# Patient Record
Sex: Female | Born: 1950 | Race: White | Hispanic: No | Marital: Single | Smoking: Former smoker
Health system: Southern US, Community
[De-identification: ages and names within clinical notes are randomized; demographics above are authoritative.]

## PROBLEM LIST (undated history)

## (undated) DIAGNOSIS — F419 Anxiety disorder, unspecified: Secondary | ICD-10-CM

## (undated) DIAGNOSIS — F32A Depression, unspecified: Secondary | ICD-10-CM

## (undated) DIAGNOSIS — F329 Major depressive disorder, single episode, unspecified: Secondary | ICD-10-CM

## (undated) DIAGNOSIS — T7840XA Allergy, unspecified, initial encounter: Secondary | ICD-10-CM

## (undated) DIAGNOSIS — E039 Hypothyroidism, unspecified: Secondary | ICD-10-CM

## (undated) DIAGNOSIS — C801 Malignant (primary) neoplasm, unspecified: Secondary | ICD-10-CM

## (undated) DIAGNOSIS — E079 Disorder of thyroid, unspecified: Secondary | ICD-10-CM

## (undated) HISTORY — DX: Disorder of thyroid, unspecified: E07.9

## (undated) HISTORY — DX: Malignant (primary) neoplasm, unspecified: C80.1

## (undated) HISTORY — PX: HERNIA REPAIR: SHX51

## (undated) HISTORY — DX: Allergy, unspecified, initial encounter: T78.40XA

## (undated) HISTORY — DX: Anxiety disorder, unspecified: F41.9

## (undated) HISTORY — PX: COLON SURGERY: SHX602

## (undated) HISTORY — DX: Depression, unspecified: F32.A

## (undated) HISTORY — DX: Major depressive disorder, single episode, unspecified: F32.9

---

## 2001-03-13 ENCOUNTER — Encounter: Admission: RE | Admit: 2001-03-13 | Discharge: 2001-03-13 | Payer: Self-pay | Admitting: Family Medicine

## 2001-03-13 ENCOUNTER — Encounter: Payer: Self-pay | Admitting: Family Medicine

## 2001-04-03 ENCOUNTER — Other Ambulatory Visit: Admission: RE | Admit: 2001-04-03 | Discharge: 2001-04-03 | Payer: Self-pay | Admitting: Family Medicine

## 2001-05-20 ENCOUNTER — Ambulatory Visit (HOSPITAL_COMMUNITY): Admission: RE | Admit: 2001-05-20 | Discharge: 2001-05-20 | Payer: Self-pay | Admitting: Gastroenterology

## 2002-06-30 ENCOUNTER — Other Ambulatory Visit: Admission: RE | Admit: 2002-06-30 | Discharge: 2002-06-30 | Payer: Self-pay | Admitting: Family Medicine

## 2002-07-23 ENCOUNTER — Encounter: Payer: Self-pay | Admitting: Family Medicine

## 2002-07-23 ENCOUNTER — Encounter: Admission: RE | Admit: 2002-07-23 | Discharge: 2002-07-23 | Payer: Self-pay | Admitting: Family Medicine

## 2003-10-21 ENCOUNTER — Encounter: Admission: RE | Admit: 2003-10-21 | Discharge: 2003-10-21 | Payer: Self-pay | Admitting: Family Medicine

## 2003-10-28 ENCOUNTER — Other Ambulatory Visit: Admission: RE | Admit: 2003-10-28 | Discharge: 2003-10-28 | Payer: Self-pay | Admitting: Family Medicine

## 2004-04-14 ENCOUNTER — Encounter: Admission: RE | Admit: 2004-04-14 | Discharge: 2004-04-14 | Payer: Self-pay | Admitting: Family Medicine

## 2004-05-08 ENCOUNTER — Inpatient Hospital Stay (HOSPITAL_COMMUNITY): Admission: RE | Admit: 2004-05-08 | Discharge: 2004-05-14 | Payer: Self-pay | Admitting: General Surgery

## 2004-05-08 ENCOUNTER — Encounter (INDEPENDENT_AMBULATORY_CARE_PROVIDER_SITE_OTHER): Payer: Self-pay | Admitting: *Deleted

## 2004-11-02 ENCOUNTER — Ambulatory Visit: Admission: RE | Admit: 2004-11-02 | Discharge: 2004-11-02 | Payer: Self-pay | Admitting: Family Medicine

## 2005-07-10 ENCOUNTER — Ambulatory Visit (HOSPITAL_COMMUNITY): Admission: RE | Admit: 2005-07-10 | Discharge: 2005-07-11 | Payer: Self-pay | Admitting: Surgery

## 2006-11-19 ENCOUNTER — Encounter: Admission: RE | Admit: 2006-11-19 | Discharge: 2006-11-19 | Payer: Self-pay | Admitting: Family Medicine

## 2007-06-25 ENCOUNTER — Encounter: Admission: RE | Admit: 2007-06-25 | Discharge: 2007-06-25 | Payer: Self-pay | Admitting: Family Medicine

## 2010-03-10 ENCOUNTER — Encounter
Admission: RE | Admit: 2010-03-10 | Discharge: 2010-03-10 | Payer: Self-pay | Source: Home / Self Care | Attending: Family Medicine | Admitting: Family Medicine

## 2010-07-21 NOTE — Op Note (Signed)
[place]  Patient:    Brandy Brooks, Brandy Brooks Visit Number: [id number] MRN: [id number]          Service Type: END Location: ENDO Attending Physician:  Charna Lavera Dictated by:   Anselmo Rod, M.D. Proc. Date: 05/20/01 Admit Date:  05/20/2001   CC:         Talmadge Coventry, M.D.   Operative Report  DATE OF BIRTH:  May 20, 1950  REFERRING PHYSICIAN:  Talmadge Coventry, M.D.  PROCEDURE PERFORMED:  Colonoscopy.  ENDOSCOPIST:  Anselmo Rod, M.D.  INSTRUMENT USED:  Olympus video colonoscope.  INDICATIONS FOR PROCEDURE:  The patient is a 60 year old white female with a history of guaiac positive stools and family history of ulcerative colitis and colonic polyps (in her mother).  Rule out colonic polyps, masses, hemorrhoids, etc.  PREPROCEDURE PREPARATION:  Informed consent was procured from the patient. The patient was fasted for eight hours prior to the procedure and prepped with a bottle of magnesium citrate and a gallon of NuLytely the night prior to the procedure.  PREPROCEDURE PHYSICAL:  The patient had stable vital signs.  Neck supple. Chest clear to auscultation.  S1, S2 regular.  Abdomen soft with normal bowel sounds.  DESCRIPTION OF PROCEDURE:  The patient was placed in the left lateral decubitus position and sedated with 60 mg of Demerol and 6 mg of Versed intravenously.  Once the patient was adequately sedated and maintained on low-flow oxygen and continuous cardiac monitoring, the Olympus video colonoscope was advanced from the rectum to the cecum without difficulty.  The terminal ileum was also visualized and appeared healthy and without lesions. the appendicular orifice and ileocecal valve were photographed.  The entire colonic mucosa appeared healthy with no evidence of masses, polyps, erosions, ulcers, ulcers or diverticular disease.  There was no evidence of hemorrhoids.  IMPRESSION:  Normal colonoscopy up to  the terminal ileum.  RECOMMENDATIONS: 1. Repeat guaiac testing will be done on an outpatient basis and further    recommendations made as needed. 2. Repeat colorectal cancer screening is recommended in the next five to    10 years unless the patient were to develop any abnormal symptoms in the    interim. Dictated by:   Anselmo Rod, M.D. Attending Physician:  Charna Myliah DD:  05/20/01 TD:  05/20/01 Job: [id number]

## 2010-07-21 NOTE — Op Note (Signed)
[name], Brandy Brooks NO.:  [id number]   MEDICAL RECORD NO.:  [id number]          PATIENT TYPE:  INP   LOCATION:  2550                         FACILITY:  [facility]   PHYSICIAN:  Gita Kudo, M.D. DATE OF BIRTH:  01-04-1951   DATE OF PROCEDURE:  05/08/2004  DATE OF DISCHARGE:                                 OPERATIVE REPORT   OPERATIVE PROCEDURE:  Exploratory laparotomy, resection of left colon,  splenic flexure with mass.   SURGEON:  Gita Kudo, M.D.   ASSISTANT:  Leonie Man, M.D.   ANESTHESIA:  General endotracheal.   PREOPERATIVE DIAGNOSIS:  Cystic mass near left colon.   POSTOPERATIVE DIAGNOSIS:  Cystic mass near left colon, frozen section  probably benign.   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 Colonoscopy a year ago was nonrevealing. CAT scan of the abdomen  and pelvis showed a cystic mass abutting against the left colon but not  intimately attached to it or connected to with contrast studies. Other  structures were normal.   OPERATIVE FINDINGS:  A full laparotomy was performed. There was some free  fluid in the pelvis and the patient had a multiply lobulated cystic mass one  of which was flat and had obviously ruptured recently as correlated by the  fluid in the abdomen. The mass was on the mesenteric side of the very  proximal descending colon. Manual and visual examination revealed a normal  pancreas, stomach, liver, gallbladder. The entire small bowel was run and  there was no abnormality. The appendix looked normal. The right and  transverse colons were normal. The sigmoid colon was also normal down to the  rectum. There were several fibroids on the uterus. There was a small benign  looking nodule on the right fallopian tube that I excised. Both ovaries  looked and felt normal. The mass was close to the bowel and I felt best to  excise the bowel with it and we did so.   OPERATIVE  PROCEDURE:  Under satisfactory general endotracheal anesthesia,  with nasogastric, Foley catheters placed and given IV antibiotics after a  bowel prep, the patient was positioned, prepped and draped in standard  fashion. A generous midline incision was made which was extended as the  procedure enfolded. Self-retaining retractors were placed after laparotomy  was performed. It was felt best to mobilize the entire colon and take down  the splenic flexure. The colon was taken down using cautery. At the splenic  flexure both of blunt sharp dissection was used with clamp, cutting and  tying of 2-0 silk to finally free it down. This then was extended freeing  the colon over to the distal transverse colon. The mass was intimately  adherent to the mesenteric surface of the bowel and I felt best to remove  the bowel with it. Accordingly the bowel was mobilized medially and then  transected proximally and distally with a GIA stapler. The mesentery was  then taken between clamps and ties of 2-0 silk. The specimen was marked with  suture and sent to pathology.  While waiting, the anastomosis was performed  with the GIA stapler. The stab wounds were then oversewn interrupted Gambee  silk sutures. Another silk suture was placed at the crotch of the staple  line. The staple line was inspected before the anastomosis completed and it  had been reinforced for some oozing with interrupted 3-0 silk. The  mesenteric defect was then closed with interrupted 2-0 silk figure-of-  eights. At this point, gloves were changed and the abdomen checked for  hemostasis. There was some bleeding from the very inferior portion of the  spleen where traction had caused a short vessels of the spleen to hemorrhage  and this was controlled with multiple clips as well as a 2-0 silk suture  ligature. It had a tiny ooze on the capsule and a piece of Surgicel was  inserted  Then the abdomen was copiously lavaged with saline. The gloves  had  been changed. The anastomosis was checked and was patent, hemostatic, had  good blood supply and lay without tension. All packings were removed and the  abdomen checked for hemostasis, lavaged with saline again and suctioned dry.  Closure was accomplished with a running double stranded #1 PDS suture for  the midline. Marcaine 30 mL was infiltrated for postop analgesia. Skin edges  approximated with staples and sterile absorbent dressing applied. No  complications and the sponge and needle counts correct.      MRL/MEDQ  D:  05/08/2004  T:  05/08/2004  Job:  [id number]   cc:   Talmadge Coventry, M.D.  [address]  [place]  [place] [address]  Fax: [phone]

## 2010-07-21 NOTE — Discharge Summary (Signed)
Brandy Brooks, MONTUORI NO.:  [id number]   MEDICAL RECORD NO.:  [id number]          PATIENT TYPE:  INP   LOCATION:  5705                         FACILITY:  [facility]   PHYSICIAN:  Gita Kudo, M.D. DATE OF BIRTH:  [birth year]   DATE OF ADMISSION:  05/08/2004  DATE OF DISCHARGE:                                 DISCHARGE SUMMARY   CHIEF COMPLAINT:  Abdominal mass.   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 Workup revealed a cystic mass,  possible GI duplication cyst near her left colon.  Her remaining history and  physical is negative.   LABORATORY STUDIES:  Pathology:  Segmental resection of the splenic flexure  and proximal descending colon with a benign cystic mass.  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  Right fallopian tube nodule  consistent with a benign cyst.  Chest x-ray showed some atelectasis - this  was postop.  A preop chest film was negative.  The patient was O negative,  did not receive any transfusions.  Urinalysis was negative.  CMET normal.  Initial hemoglobin 14, hematocrit 40, white count 4,100.  Postop hemoglobin  11, hematocrit 31, white count 8,900.   HOSPITAL COURSE:  On the morning of admission, the patient went to the  operating room and had a resection of her colon with the adjacent mass.  A  primary anastomosis was performed.  Postoperatively, she did well.  She had  a little difficulty maintaining O2 saturations and was on oxygen and  respiratory therapy.  This improved nicely with aggressive pulmonary care.  She regained bowel function, was voiding.  Her catheters were removed on  schedule, and she was tolerating a diet and passing gas when discharged from  the hospital on May 14, 2004, her sixth postop day.  She was allowed home  on a regular diet, Vicodin for pain, continue her previous medications.  Return to the office for followup in  approximately three weeks.   DISCHARGE DIAGNOSIS:  Mesothelioma.   OPERATION:  Resection of splenic flexure and descending colon with adjacent  mesothelioma.   COMPLICATIONS, INFECTIONS, CONSULTATIONS:  None.   CONDITION AT DISCHARGE:  Good.      MRL/MEDQ  D:  05/14/2004  T:  05/15/2004  Job:  [id number]   cc:   Talmadge Coventry, M.D.  [address]  [place]  [place] [address]  Fax: [phone]

## 2010-07-21 NOTE — Op Note (Signed)
NAMEOLIVIA, Brandy Brooks          ACCOUNT NO.:  [id number]   MEDICAL RECORD NO.:  [id number]          PATIENT TYPE:  AMB   LOCATION:  DAY                          FACILITY:  [facility]   PHYSICIAN:  Thornton Park. Daphine Deutscher, MD  DATE OF BIRTH:  Mar 24, 1950   DATE OF PROCEDURE:  DATE OF DISCHARGE:                                 OPERATIVE REPORT   PREOPERATIVE DIAGNOSIS:  Ventral incisional hernia.   POSTOPERATIVE DIAGNOSIS:  Ventral incisional hernia status post repair with  12 cm round __________ polyester coated mesh.   SURGEON:  Thornton Park. Daphine Deutscher, MD.   ANESTHESIA:  General.   ASSISTANT:  None.   DESCRIPTION OF PROCEDURE:  Ms. Hafsah Hendler was taken to room 1 on Jul 10, 2005 and given general anesthesia.  The abdomen was prepped with  chlorhexidine and draped sterilely.  I entered the abdomen using an OptiView  technique to the right upper quadrant without difficulty and insufflated the  abdomen through this 5 mm trocar.  A second 5 mm was placed on the right  side inferiorly and I then used a harmonic scalpel to take down numerous  vascular adhesions that were stuck up in this ventral hernia.  The hernia  was actually where the fascial dehiscence at side of the umbilicus and all  adhesions were taken down to delineate the size of this.  It was about two  fingerbreadth's in diameter at least grossly when I saw her preoperatively.  I went ahead and used needles to mark the parameter to give a good margin of  normal tissue lateral to the hernia defect.  I then used a 12 cm auto suture  polyester mesh with a coated side protecting the bowel.  This was oriented.  Four sutures of Novofil were placed on the uncoated side to be pulled up to  the anterior abdominal wall.  It was then soaked, rolled and placed through  a 10/11 trocar which I had subsequently placed in left upper quadrant and  another 5 in the left lower quadrant.  I then placed the mesh and rolled it  out and then made  little stab wounds with an 11 blade and reached in and  picked up the edges of the mesh sutured.  I did have to move the one on the  patient's left side a little more laterally to get it to deploy a little  farther out and that looked much better.  These were tied down and then the  parameter was tacked with a tacker first dissecting the ovary between each  of the sutures and then placed and then going in and filling in this was  with approximately 15 or 16 tacks to allow for good adherence to the  anterior abdominal wall.  When completed, I looked around, surveyed the  abdomen and there was essentially no bleeding noted.  The bowel looked  good and I had not and encountered any bowel at all in the process of doing  this.  The wounds were then closed with 4-0 Vicryl subcutaneously with  Benzoin Steri-Strips.  Prior to doing this, I injected them with  Marcaine.  The patient will be given a prescription for Percocet.  She will be kept for  overnight observation.      Thornton Park Daphine Deutscher, MD  Electronically Signed     MBM/MEDQ  D:  07/10/2005  T:  07/11/2005  Job:  [id number]   cc:   Talmadge Coventry, M.D.  Fax: [phone]

## 2010-07-21 NOTE — Op Note (Signed)
NAMELUVIA, ORZECHOWSKI          ACCOUNT NO.:  [id number]   MEDICAL RECORD NO.:  [id number]          PATIENT TYPE:  INP   LOCATION:  2550                         FACILITY:  [facility]   PHYSICIAN:  Gita Kudo, M.D. DATE OF BIRTH:  07/16/1950   DATE OF PROCEDURE:  DATE OF DISCHARGE:                                 OPERATIVE REPORT   Audio too short to transcribe (less than 5 seconds)      MRL/MEDQ  D:  05/08/2004  T:  05/08/2004  Job:  [id number]

## 2014-02-08 ENCOUNTER — Ambulatory Visit (INDEPENDENT_AMBULATORY_CARE_PROVIDER_SITE_OTHER): Payer: BC Managed Care – PPO | Admitting: Physician Assistant

## 2014-02-08 VITALS — BP 126/74 | HR 65 | Temp 98.5°F | Resp 18 | Ht 64.0 in | Wt 185.8 lb

## 2014-02-08 DIAGNOSIS — Z23 Encounter for immunization: Secondary | ICD-10-CM

## 2014-02-08 DIAGNOSIS — E039 Hypothyroidism, unspecified: Secondary | ICD-10-CM | POA: Insufficient documentation

## 2014-02-08 MED ORDER — LEVOTHYROXINE SODIUM 100 MCG PO TABS: 100.0000 ug | ORAL_TABLET | Freq: Every day | ORAL | Status: DC

## 2014-02-08 NOTE — Progress Notes (Signed)
I have discussed this case with Ms. Bush, PA-C and agree.  

## 2014-02-08 NOTE — Progress Notes (Signed)
   Subjective:    Patient ID: Brandy Brooks, female    DOB: [birth year], 63 y.o.   MRN: [id number]  HPI  This is a 63 year old female with PMH primary hypothyroidism who is presenting for a refill of her thyroid medication. She has been on thyroid medication for 10 years. She has been out of her medication for 1.5 months. She does not currently have a PCP here. She reports her and her boyfriend have a home in [place], [place] and they stay there in the summers. For the past 3 years she has been seen yearly by someone in [place]. She reports she has been fatigued. She denies constipation, skin or hair complaints.   She has scheduled a new pt appt with a PCP on January 5th.  She is a music history Pharmacist, hospital at [employer].  Review of Systems  Constitutional: Negative for fever and chills.  Cardiovascular: Negative for palpitations.  Gastrointestinal: Negative for constipation.  Endocrine: Negative for cold intolerance and heat intolerance.  Skin: Negative.     Patient Active Problem List   Diagnosis Date Noted  . Hypothyroidism 02/08/2014   Prior to Admission medications   Medication Sig Start Date End Date Taking? Authorizing Provider  levothyroxine (SYNTHROID, LEVOTHROID) 100 MCG tablet Take 1 tablet (100 mcg total) by mouth daily before breakfast. 02/08/14  Yes Bennett Scrape V, PA-C   Allergies  Allergen Reactions  . Other Photosensitivity    Pt. Is sensitive to steroids.    Patient's social and family history were reviewed.     Objective:   Physical Exam  Constitutional: She is oriented to person, place, and time. She appears well-developed and well-nourished. No distress.  HENT:  Head: Normocephalic and atraumatic.  Right Ear: Hearing normal.  Left Ear: Hearing normal.  Nose: Nose normal.  Eyes: Conjunctivae and lids are normal. Right eye exhibits no discharge. Left eye exhibits no discharge. No scleral icterus.  Neck: Trachea normal. No thyromegaly present.  Cardiovascular: Normal  rate, regular rhythm, normal heart sounds, intact distal pulses and normal pulses.   No murmur heard. Pulmonary/Chest: Effort normal and breath sounds normal. No respiratory distress. She has no wheezes. She has no rhonchi. She has no rales.  Musculoskeletal: Normal range of motion.  Neurological: She is alert and oriented to person, place, and time.  Skin: Skin is warm, dry and intact. No lesion and no rash noted.  Psychiatric: She has a normal mood and affect. Her speech is normal and behavior is normal. Thought content normal.      Assessment & Plan:  1. Hypothyroidism, unspecified hypothyroidism type Refilled medication with 1 refill to last her until she sees her new PCP. TSH pending. - TSH - levothyroxine (SYNTHROID, LEVOTHROID) 100 MCG tablet; Take 1 tablet (100 mcg total) by mouth daily before breakfast.  Dispense: 30 tablet; Refill: 1  2. Need for influenza vaccination - Flu Vaccine QUAD 36+ mos IM   Benjaman Pott. Drenda Freeze, [facility]  02/08/2014

## 2014-02-08 NOTE — Patient Instructions (Signed)
I have sent thyroid medication to pharmacy with one refill. Will call you within 10 days with the results of your thyroid blood test.

## 2014-02-09 LAB — TSH: TSH: 7.071 u[IU]/mL — AB (ref 0.350–4.500)

## 2014-04-02 ENCOUNTER — Other Ambulatory Visit: Payer: Self-pay | Admitting: Physician Assistant

## 2015-01-06 ENCOUNTER — Other Ambulatory Visit: Payer: Self-pay

## 2015-01-06 DIAGNOSIS — Z1231 Encounter for screening mammogram for malignant neoplasm of breast: Secondary | ICD-10-CM

## 2015-01-20 ENCOUNTER — Ambulatory Visit
Admission: RE | Admit: 2015-01-20 | Discharge: 2015-01-20 | Disposition: A | Discharge status: Home / Self Care | Payer: BC Managed Care – PPO | Source: Ambulatory Visit

## 2015-01-20 DIAGNOSIS — Z1231 Encounter for screening mammogram for malignant neoplasm of breast: Secondary | ICD-10-CM

## 2015-02-03 ENCOUNTER — Other Ambulatory Visit: Payer: Self-pay | Admitting: Family Medicine

## 2015-02-03 ENCOUNTER — Other Ambulatory Visit (HOSPITAL_COMMUNITY)
Admission: RE | Admit: 2015-02-03 | Discharge: 2015-02-03 | Disposition: A | Discharge status: Home / Self Care | Payer: BC Managed Care – PPO | Source: Ambulatory Visit | Attending: Family Medicine | Admitting: Family Medicine

## 2015-02-03 DIAGNOSIS — Z1151 Encounter for screening for human papillomavirus (HPV): Secondary | ICD-10-CM | POA: Insufficient documentation

## 2015-02-03 DIAGNOSIS — Z01419 Encounter for gynecological examination (general) (routine) without abnormal findings: Secondary | ICD-10-CM | POA: Diagnosis present

## 2015-02-07 LAB — CYTOLOGY - PAP

## 2015-03-10 ENCOUNTER — Ambulatory Visit (INDEPENDENT_AMBULATORY_CARE_PROVIDER_SITE_OTHER): Payer: BC Managed Care – PPO | Admitting: Family Medicine

## 2015-03-10 VITALS — BP 122/82 | HR 77 | Temp 98.0°F | Resp 16 | Ht 64.0 in | Wt 191.8 lb

## 2015-03-10 DIAGNOSIS — E039 Hypothyroidism, unspecified: Secondary | ICD-10-CM

## 2015-03-10 DIAGNOSIS — R002 Palpitations: Secondary | ICD-10-CM

## 2015-03-10 DIAGNOSIS — R0789 Other chest pain: Secondary | ICD-10-CM

## 2015-03-10 NOTE — Patient Instructions (Addendum)
Decrease alcohol to no more than 2 drinks per day. See information below on stress and stress management, make sure you are getting sufficient sleep. All these things may have contributed to palpitations. Your heart rhythm appears to be normal today, and your most recent lab work last month including thyroid tests and blood counts appears to be okay. If you have any return of chest pain or pressure, or heart palpitations persist, proceed to the emergency room for further testing. Please let me know if you have any questions in the meantime.  Palpitations A palpitation is the feeling that your heartbeat is irregular or is faster than normal. It may feel like your heart is fluttering or skipping a beat. Palpitations are usually not a serious problem. However, in some cases, you may need further medical evaluation. CAUSES  Palpitations can be caused by:  Smoking.  Caffeine or other stimulants, such as diet pills or energy drinks.  Alcohol.  Stress and anxiety.  Strenuous physical activity.  Fatigue.  Certain medicines.  Heart disease, especially if you have a history of irregular heart rhythms (arrhythmias), such as atrial fibrillation, atrial flutter, or supraventricular tachycardia.  An improperly working pacemaker or defibrillator. DIAGNOSIS  To find the cause of your palpitations, your health care provider will take your medical history and perform a physical exam. Your health care provider may also have you take a test called an ambulatory electrocardiogram (ECG). An ECG records your heartbeat patterns over a 24-hour period. You may also have other tests, such as:  Transthoracic echocardiogram (TTE). During echocardiography, sound waves are used to evaluate how blood flows through your heart.  Transesophageal echocardiogram (TEE).  Cardiac monitoring. This allows your health care provider to monitor your heart rate and rhythm in real time.  Holter monitor. This is a portable  device that records your heartbeat and can help diagnose heart arrhythmias. It allows your health care provider to track your heart activity for several days, if needed.  Stress tests by exercise or by giving medicine that makes the heart beat faster. TREATMENT  Treatment of palpitations depends on the cause of your symptoms and can vary greatly. Most cases of palpitations do not require any treatment other than time, relaxation, and monitoring your symptoms. Other causes, such as atrial fibrillation, atrial flutter, or supraventricular tachycardia, usually require further treatment. HOME CARE INSTRUCTIONS   Avoid:  Caffeinated coffee, tea, soft drinks, diet pills, and energy drinks.  Chocolate.  Alcohol.  Stop smoking if you smoke.  Reduce your stress and anxiety. Things that can help you relax include:  A method of controlling things in your body, such as your heartbeats, with your mind (biofeedback).  Yoga.  Meditation.  Physical activity such as swimming, jogging, or walking.  Get plenty of rest and sleep. SEEK MEDICAL CARE IF:   You continue to have a fast or irregular heartbeat beyond 24 hours.  Your palpitations occur more often. SEEK IMMEDIATE MEDICAL CARE IF:  You have chest pain or shortness of breath.  You have a severe headache.  You feel dizzy or you faint. MAKE SURE YOU:  Understand these instructions.  Will watch your condition.  Will get help right away if you are not doing well or get worse.   This information is not intended to replace advice given to you by your health care provider. Make sure you discuss any questions you have with your health care provider.   Document Released: 02/17/2000 Document Revised: 02/24/2013 Document Reviewed: 04/20/2011 Elsevier Interactive  Patient Education 2016 [employer] and Stress Management Stress is a normal reaction to life events. It is what you feel when life demands more than you are used to or  more than you can handle. Some stress can be useful. For example, the stress reaction can help you catch the last bus of the day, study for a test, or meet a deadline at work. But stress that occurs too often or for too long can cause problems. It can affect your emotional health and interfere with relationships and normal daily activities. Too much stress can weaken your immune system and increase your risk for physical illness. If you already have a medical problem, stress can make it worse. CAUSES  All sorts of life events may cause stress. An event that causes stress for one person may not be stressful for another person. Major life events commonly cause stress. These may be positive or negative. Examples include losing your job, moving into a new home, getting married, having a baby, or losing a loved one. Less obvious life events may also cause stress, especially if they occur day after day or in combination. Examples include working long hours, driving in traffic, caring for children, being in debt, or being in a difficult relationship. SIGNS AND SYMPTOMS Stress may cause emotional symptoms including, the following:  Anxiety. This is feeling worried, afraid, on edge, overwhelmed, or out of control.  Anger. This is feeling irritated or impatient.  Depression. This is feeling sad, down, helpless, or guilty.  Difficulty focusing, remembering, or making decisions. Stress may cause physical symptoms, including the following:   Aches and pains. These may affect your head, neck, back, stomach, or other areas of your body.  Tight muscles or clenched jaw.  Low energy or trouble sleeping. Stress may cause unhealthy behaviors, including the following:   Eating to feel better (overeating) or skipping meals.  Sleeping too little, too much, or both.  Working too much or putting off tasks (procrastination).  Smoking, drinking alcohol, or using drugs to feel better. DIAGNOSIS  Stress is  diagnosed through an assessment by your health care provider. Your health care provider will ask questions about your symptoms and any stressful life events.Your health care provider will also ask about your medical history and may order blood tests or other tests. Certain medical conditions and medicine can cause physical symptoms similar to stress. Mental illness can cause emotional symptoms and unhealthy behaviors similar to stress. Your health care provider may refer you to a mental health professional for further evaluation.  TREATMENT  Stress management is the recommended treatment for stress.The goals of stress management are reducing stressful life events and coping with stress in healthy ways.  Techniques for reducing stressful life events include the following:  Stress identification. Self-monitor for stress and identify what causes stress for you. These skills may help you to avoid some stressful events.  Time management. Set your priorities, keep a calendar of events, and learn to say "no." These tools can help you avoid making too many commitments. Techniques for coping with stress include the following:  Rethinking the problem. Try to think realistically about stressful events rather than ignoring them or overreacting. Try to find the positives in a stressful situation rather than focusing on the negatives.  Exercise. Physical exercise can release both physical and emotional tension. The key is to find a form of exercise you enjoy and do it regularly.  Relaxation techniques. These relax the body and mind. Examples  include yoga, meditation, tai chi, biofeedback, deep breathing, progressive muscle relaxation, listening to music, being out in nature, journaling, and other hobbies. Again, the key is to find one or more that you enjoy and can do regularly.  Healthy lifestyle. Eat a balanced diet, get plenty of sleep, and do not smoke. Avoid using alcohol or drugs to relax.  Strong  support network. Spend time with family, friends, or other people you enjoy being around.Express your feelings and talk things over with someone you trust. Counseling or talktherapy with a mental health professional may be helpful if you are having difficulty managing stress on your own. Medicine is typically not recommended for the treatment of stress.Talk to your health care provider if you think you need medicine for symptoms of stress. HOME CARE INSTRUCTIONS  Keep all follow-up visits as directed by your health care provider.  Take all medicines as directed by your health care provider. SEEK MEDICAL CARE IF:  Your symptoms get worse or you start having new symptoms.  You feel overwhelmed by your problems and can no longer manage them on your own. SEEK IMMEDIATE MEDICAL CARE IF:  You feel like hurting yourself or someone else.   This information is not intended to replace advice given to you by your health care provider. Make sure you discuss any questions you have with your health care provider.   Document Released: 08/15/2000 Document Revised: 03/12/2014 Document Reviewed: 10/14/2012 Elsevier Interactive Patient Education 2016 [employer]  Nonspecific Chest Pain  Chest pain can be caused by many different conditions. There is always a chance that your pain could be related to something serious, such as a heart attack or a blood clot in your lungs. Chest pain can also be caused by conditions that are not life-threatening. If you have chest pain, it is very important to follow up with your health care provider. CAUSES  Chest pain can be caused by:  Heartburn.  Pneumonia or bronchitis.  Anxiety or stress.  Inflammation around your heart (pericarditis) or lung (pleuritis or pleurisy).  A blood clot in your lung.  A collapsed lung (pneumothorax). It can develop suddenly on its own (spontaneous pneumothorax) or from trauma to the chest.  Shingles infection (varicella-zoster  virus).  Heart attack.  Damage to the bones, muscles, and cartilage that make up your chest wall. This can include:  Bruised bones due to injury.  Strained muscles or cartilage due to frequent or repeated coughing or overwork.  Fracture to one or more ribs.  Sore cartilage due to inflammation (costochondritis). RISK FACTORS  Risk factors for chest pain may include:  Activities that increase your risk for trauma or injury to your chest.  Respiratory infections or conditions that cause frequent coughing.  Medical conditions or overeating that can cause heartburn.  Heart disease or family history of heart disease.  Conditions or health behaviors that increase your risk of developing a blood clot.  Having had chicken pox (varicella zoster). SIGNS AND SYMPTOMS Chest pain can feel like:  Burning or tingling on the surface of your chest or deep in your chest.  Crushing, pressure, aching, or squeezing pain.  Dull or sharp pain that is worse when you move, cough, or take a deep breath.  Pain that is also felt in your back, neck, shoulder, or arm, or pain that spreads to any of these areas. Your chest pain may come and go, or it may stay constant. DIAGNOSIS Lab tests or other studies may be needed to find  the cause of your pain. Your health care provider may have you take a test called an ambulatory ECG (electrocardiogram). An ECG records your heartbeat patterns at the time the test is performed. You may also have other tests, such as:  Transthoracic echocardiogram (TTE). During echocardiography, sound waves are used to create a picture of all of the heart structures and to look at how blood flows through your heart.  Transesophageal echocardiogram (TEE).This is a more advanced imaging test that obtains images from inside your body. It allows your health care provider to see your heart in finer detail.  Cardiac monitoring. This allows your health care provider to monitor your  heart rate and rhythm in real time.  Holter monitor. This is a portable device that records your heartbeat and can help to diagnose abnormal heartbeats. It allows your health care provider to track your heart activity for several days, if needed.  Stress tests. These can be done through exercise or by taking medicine that makes your heart beat more quickly.  Blood tests.  Imaging tests. TREATMENT  Your treatment depends on what is causing your chest pain. Treatment may include:  Medicines. These may include:  Acid blockers for heartburn.  Anti-inflammatory medicine.  Pain medicine for inflammatory conditions.  Antibiotic medicine, if an infection is present.  Medicines to dissolve blood clots.  Medicines to treat coronary artery disease.  Supportive care for conditions that do not require medicines. This may include:  Resting.  Applying heat or cold packs to injured areas.  Limiting activities until pain decreases. HOME CARE INSTRUCTIONS  If you were prescribed an antibiotic medicine, finish it all even if you start to feel better.  Avoid any activities that bring on chest pain.  Do not use any tobacco products, including cigarettes, chewing tobacco, or electronic cigarettes. If you need help quitting, ask your health care provider.  Do not drink alcohol.  Take medicines only as directed by your health care provider.  Keep all follow-up visits as directed by your health care provider. This is important. This includes any further testing if your chest pain does not go away.  If heartburn is the cause for your chest pain, you may be told to keep your head raised (elevated) while sleeping. This reduces the chance that acid will go from your stomach into your esophagus.  Make lifestyle changes as directed by your health care provider. These may include:  Getting regular exercise. Ask your health care provider to suggest some activities that are safe for you.  Eating a  heart-healthy diet. A registered dietitian can help you to learn healthy eating options.  Maintaining a healthy weight.  Managing diabetes, if necessary.  Reducing stress. SEEK MEDICAL CARE IF:  Your chest pain does not go away after treatment.  You have a rash with blisters on your chest.  You have a fever. SEEK IMMEDIATE MEDICAL CARE IF:   Your chest pain is worse.  You have an increasing cough, or you cough up blood.  You have severe abdominal pain.  You have severe weakness.  You faint.  You have chills.  You have sudden, unexplained chest discomfort.  You have sudden, unexplained discomfort in your arms, back, neck, or jaw.  You have shortness of breath at any time.  You suddenly start to sweat, or your skin gets clammy.  You feel nauseous or you vomit.  You suddenly feel light-headed or dizzy.  Your heart begins to beat quickly, or it feels like it is  skipping beats. These symptoms may represent a serious problem that is an emergency. Do not wait to see if the symptoms will go away. Get medical help right away. Call your local emergency services (911 in the [place] Do not drive yourself to the hospital.   This information is not intended to replace advice given to you by your health care provider. Make sure you discuss any questions you have with your health care provider.   Document Released: 11/29/2004 Document Revised: 03/12/2014 Document Reviewed: 09/25/2013 Elsevier Interactive Patient Education [employer].

## 2015-03-10 NOTE — Progress Notes (Signed)
Subjective:    Patient ID: Brandy Brooks, female    DOB: 1951/02/07, 65 y.o.   MRN: [id number] By signing my name below, I, Judithe Modest, attest that this documentation has been prepared under the direction and in the presence of Merri Ray, MD. Electronically Signed: Judithe Modest, ER Scribe. 03/10/2015. 9:31 AM.  Chief Complaint  Patient presents with  . Irregular Heart Beat    Pt. says she noticed this morning that her heart was skipping beats  . Tachycardia    Pt. says she noticed it was beating fast this morning  . Flu Vaccine    HPI HPI Comments: Brandy Brooks is a 65 y.o. female with a hx of Hypothyroidism who presents to [facility] complaining of irregular heart beat this morning when she woke up. She states she was having weaker beats, then no beats then a stronger beat intermittently. She is having increased stress lately. She denies light headedness or dizziness, SOB, HA, diaphoresis, or blurry vision. She states she had a couple minutes of chest pressure. Her PCP is Dr. Criss Rosales. She missed a dose of her thyroid medication one time one week ago. She has not changed her caffeine consumption lately. She drinks two large cappuccinos every morning. She denies having a family hx of heart disease. She went to the ER due to chest pain in the past, but it proved to be heartburn. She drinks two or three beers per day. She reported to a rheumatologist recently and her BP was 144/100. She had a normal CMP with LMP on 02/03/2015. Normal CBC 02/02/2015 with normal TSH, Cholesterol total 216, LDL 138, HDL 66, triglyceride 60.   Hypothyroidism, last seen for this in December 2015. TSH at that time was 7.07. She was advised to follow up with her PCP.   She is a professor at [employer].   Patient Active Problem List   Diagnosis Date Noted  . Hypothyroidism 02/08/2014   Past Medical History  Diagnosis Date  . Allergy   . Cancer ([place])     skin cancer  . Depression   . Thyroid  disease   . Anxiety    Past Surgical History  Procedure Laterality Date  . Colon surgery    . Hernia repair     Allergies  Allergen Reactions  . Other Photosensitivity    Pt. Is sensitive to steroids.    Prior to Admission medications   Medication Sig Start Date End Date Taking? Authorizing Provider  levothyroxine (SYNTHROID, LEVOTHROID) 100 MCG tablet TAKE 1 TABLET (100 MCG TOTAL) BY MOUTH DAILY BEFORE BREAKFAST.  "OV NEEDED FOR ADDITIONAL REFILLS" 04/02/14  Yes Mancel Bale, PA-C   Social History   Social History  . Marital Status: Single    Spouse Name: N/A  . Number of Children: N/A  . Years of Education: N/A   Occupational History  . Not on file.   Social History Main Topics  . Smoking status: Never Smoker   . Smokeless tobacco: Not on file  . Alcohol Use: 0.0 oz/week    0 Standard drinks or equivalent per week     Comment: 12-15 drinks   . Drug Use: No  . Sexual Activity: Not on file   Other Topics Concern  . Not on file   Social History Narrative    Review of Systems  Constitutional: Negative for fever, chills, fatigue and unexpected weight change.  Respiratory: Negative for chest tightness and shortness of breath.   Cardiovascular: Positive  for palpitations. Negative for chest pain and leg swelling.  Gastrointestinal: Negative for abdominal pain and blood in stool.  Neurological: Negative for dizziness, syncope, light-headedness and headaches.      Objective:  BP 122/82 mmHg  Pulse 77  Temp(Src) 98 F (36.7 C) (Oral)  Resp 16  Ht '5\' 4"'  (1.626 m)  Wt 191 lb 12.8 oz (87 kg)  BMI 32.91 kg/m2  SpO2 95%  Physical Exam  Constitutional: She is oriented to person, place, and time. She appears well-developed and well-nourished. No distress.  HENT:  Head: Normocephalic and atraumatic.  Eyes: Pupils are equal, round, and reactive to light.  Neck: Neck supple.  Cardiovascular: Normal rate.   Pulmonary/Chest: Effort normal. No respiratory distress.    Musculoskeletal: Normal range of motion.  Neurological: She is alert and oriented to person, place, and time. Coordination normal.  Skin: Skin is warm and dry. She is not diaphoretic.  Psychiatric: She has a normal mood and affect. Her behavior is normal.  Nursing note and vitals reviewed.  EKG:  Sinus rhythm, no apparent acute findings.     Assessment & Plan:  Brandy Brooks is a 65 y.o. female Palpitations - Plan: EKG 12-Lead, CANCELED: POCT CBC, CANCELED: TSH  Chest tightness or pressure - Plan: EKG 12-Lead, CANCELED: POCT CBC  Hypothyroidism, unspecified hypothyroidism type   history of palpitations early this morning with very brief chest sensation that resolved within a few minutes. EKG indicates sinus rhythm in the office, review of recent lab work with normal CBC, CMP, TSH. Did have a few alcoholic drinks last night, some decreased sleep, increased stressors recently, and does drink caffeinated drinks. All these may have contributed to her palpitations this morning.   -As she is asymptomatic currently, decided against further evaluation at this time. However if the palpitations recur, or any return of chest pain or pressure, advised to go to emergency room.   Also advised on stress management techniques as below, cut back on alcohol to no more than 1 at the most 2 drinks per day, and cut back on caffeine intake as well.    - If she continues to have intermittent palpitations, cardiology evaluation may be needed. RTC/ER precautions discussed.  No orders of the defined types were placed in this encounter.   Patient Instructions   Decrease alcohol to no more than 2 drinks per day. See information below on stress and stress management, make sure you are getting sufficient sleep. All these things may have contributed to palpitations. Your heart rhythm appears to be normal today, and your most recent lab work last month including thyroid tests and blood counts appears to be okay. If  you have any return of chest pain or pressure, or heart palpitations persist, proceed to the emergency room for further testing. Please let me know if you have any questions in the meantime.  Palpitations A palpitation is the feeling that your heartbeat is irregular or is faster than normal. It may feel like your heart is fluttering or skipping a beat. Palpitations are usually not a serious problem. However, in some cases, you may need further medical evaluation. CAUSES  Palpitations can be caused by:  Smoking.  Caffeine or other stimulants, such as diet pills or energy drinks.  Alcohol.  Stress and anxiety.  Strenuous physical activity.  Fatigue.  Certain medicines.  Heart disease, especially if you have a history of irregular heart rhythms (arrhythmias), such as atrial fibrillation, atrial flutter, or supraventricular tachycardia.  An improperly  working pacemaker or defibrillator. DIAGNOSIS  To find the cause of your palpitations, your health care provider will take your medical history and perform a physical exam. Your health care provider may also have you take a test called an ambulatory electrocardiogram (ECG). An ECG records your heartbeat patterns over a 24-hour period. You may also have other tests, such as:  Transthoracic echocardiogram (TTE). During echocardiography, sound waves are used to evaluate how blood flows through your heart.  Transesophageal echocardiogram (TEE).  Cardiac monitoring. This allows your health care provider to monitor your heart rate and rhythm in real time.  Holter monitor. This is a portable device that records your heartbeat and can help diagnose heart arrhythmias. It allows your health care provider to track your heart activity for several days, if needed.  Stress tests by exercise or by giving medicine that makes the heart beat faster. TREATMENT  Treatment of palpitations depends on the cause of your symptoms and can vary greatly. Most  cases of palpitations do not require any treatment other than time, relaxation, and monitoring your symptoms. Other causes, such as atrial fibrillation, atrial flutter, or supraventricular tachycardia, usually require further treatment. HOME CARE INSTRUCTIONS   Avoid:  Caffeinated coffee, tea, soft drinks, diet pills, and energy drinks.  Chocolate.  Alcohol.  Stop smoking if you smoke.  Reduce your stress and anxiety. Things that can help you relax include:  A method of controlling things in your body, such as your heartbeats, with your mind (biofeedback).  Yoga.  Meditation.  Physical activity such as swimming, jogging, or walking.  Get plenty of rest and sleep. SEEK MEDICAL CARE IF:   You continue to have a fast or irregular heartbeat beyond 24 hours.  Your palpitations occur more often. SEEK IMMEDIATE MEDICAL CARE IF:  You have chest pain or shortness of breath.  You have a severe headache.  You feel dizzy or you faint. MAKE SURE YOU:  Understand these instructions.  Will watch your condition.  Will get help right away if you are not doing well or get worse.   This information is not intended to replace advice given to you by your health care provider. Make sure you discuss any questions you have with your health care provider.   Document Released: 02/17/2000 Document Revised: 02/24/2013 Document Reviewed: 04/20/2011 Elsevier Interactive Patient Education 2016 [employer] and Stress Management Stress is a normal reaction to life events. It is what you feel when life demands more than you are used to or more than you can handle. Some stress can be useful. For example, the stress reaction can help you catch the last bus of the day, study for a test, or meet a deadline at work. But stress that occurs too often or for too long can cause problems. It can affect your emotional health and interfere with relationships and normal daily activities. Too much  stress can weaken your immune system and increase your risk for physical illness. If you already have a medical problem, stress can make it worse. CAUSES  All sorts of life events may cause stress. An event that causes stress for one person may not be stressful for another person. Major life events commonly cause stress. These may be positive or negative. Examples include losing your job, moving into a new home, getting married, having a baby, or losing a loved one. Less obvious life events may also cause stress, especially if they occur day after day or in combination. Examples include working  long hours, driving in traffic, caring for children, being in debt, or being in a difficult relationship. SIGNS AND SYMPTOMS Stress may cause emotional symptoms including, the following:  Anxiety. This is feeling worried, afraid, on edge, overwhelmed, or out of control.  Anger. This is feeling irritated or impatient.  Depression. This is feeling sad, down, helpless, or guilty.  Difficulty focusing, remembering, or making decisions. Stress may cause physical symptoms, including the following:   Aches and pains. These may affect your head, neck, back, stomach, or other areas of your body.  Tight muscles or clenched jaw.  Low energy or trouble sleeping. Stress may cause unhealthy behaviors, including the following:   Eating to feel better (overeating) or skipping meals.  Sleeping too little, too much, or both.  Working too much or putting off tasks (procrastination).  Smoking, drinking alcohol, or using drugs to feel better. DIAGNOSIS  Stress is diagnosed through an assessment by your health care provider. Your health care provider will ask questions about your symptoms and any stressful life events.Your health care provider will also ask about your medical history and may order blood tests or other tests. Certain medical conditions and medicine can cause physical symptoms similar to stress.  Mental illness can cause emotional symptoms and unhealthy behaviors similar to stress. Your health care provider may refer you to a mental health professional for further evaluation.  TREATMENT  Stress management is the recommended treatment for stress.The goals of stress management are reducing stressful life events and coping with stress in healthy ways.  Techniques for reducing stressful life events include the following:  Stress identification. Self-monitor for stress and identify what causes stress for you. These skills may help you to avoid some stressful events.  Time management. Set your priorities, keep a calendar of events, and learn to say "no." These tools can help you avoid making too many commitments. Techniques for coping with stress include the following:  Rethinking the problem. Try to think realistically about stressful events rather than ignoring them or overreacting. Try to find the positives in a stressful situation rather than focusing on the negatives.  Exercise. Physical exercise can release both physical and emotional tension. The key is to find a form of exercise you enjoy and do it regularly.  Relaxation techniques. These relax the body and mind. Examples include yoga, meditation, tai chi, biofeedback, deep breathing, progressive muscle relaxation, listening to music, being out in nature, journaling, and other hobbies. Again, the key is to find one or more that you enjoy and can do regularly.  Healthy lifestyle. Eat a balanced diet, get plenty of sleep, and do not smoke. Avoid using alcohol or drugs to relax.  Strong support network. Spend time with family, friends, or other people you enjoy being around.Express your feelings and talk things over with someone you trust. Counseling or talktherapy with a mental health professional may be helpful if you are having difficulty managing stress on your own. Medicine is typically not recommended for the treatment of  stress.Talk to your health care provider if you think you need medicine for symptoms of stress. HOME CARE INSTRUCTIONS  Keep all follow-up visits as directed by your health care provider.  Take all medicines as directed by your health care provider. SEEK MEDICAL CARE IF:  Your symptoms get worse or you start having new symptoms.  You feel overwhelmed by your problems and can no longer manage them on your own. SEEK IMMEDIATE MEDICAL CARE IF:  You feel like hurting yourself  or someone else.   This information is not intended to replace advice given to you by your health care provider. Make sure you discuss any questions you have with your health care provider.   Document Released: 08/15/2000 Document Revised: 03/12/2014 Document Reviewed: 10/14/2012 Elsevier Interactive Patient Education 2016 Elsevier Inc[employer]  Nonspecific Chest Pain  Chest pain can be caused by many different conditions. There is always a chance that your pain could be related to something serious, such as a heart attack or a blood clot in your lungs. Chest pain can also be caused by conditions that are not life-threatening. If you have chest pain, it is very important to follow up with your health care provider. CAUSES  Chest pain can be caused by:  Heartburn.  Pneumonia or bronchitis.  Anxiety or stress.  Inflammation around your heart (pericarditis) or lung (pleuritis or pleurisy).  A blood clot in your lung.  A collapsed lung (pneumothorax). It can develop suddenly on its own (spontaneous pneumothorax) or from trauma to the chest.  Shingles infection (varicella-zoster virus).  Heart attack.  Damage to the bones, muscles, and cartilage that make up your chest wall. This can include:  Bruised bones due to injury.  Strained muscles or cartilage due to frequent or repeated coughing or overwork.  Fracture to one or more ribs.  Sore cartilage due to inflammation (costochondritis). RISK FACTORS  Risk  factors for chest pain may include:  Activities that increase your risk for trauma or injury to your chest.  Respiratory infections or conditions that cause frequent coughing.  Medical conditions or overeating that can cause heartburn.  Heart disease or family history of heart disease.  Conditions or health behaviors that increase your risk of developing a blood clot.  Having had chicken pox (varicella zoster). SIGNS AND SYMPTOMS Chest pain can feel like:  Burning or tingling on the surface of your chest or deep in your chest.  Crushing, pressure, aching, or squeezing pain.  Dull or sharp pain that is worse when you move, cough, or take a deep breath.  Pain that is also felt in your back, neck, shoulder, or arm, or pain that spreads to any of these areas. Your chest pain may come and go, or it may stay constant. DIAGNOSIS Lab tests or other studies may be needed to find the cause of your pain. Your health care provider may have you take a test called an ambulatory ECG (electrocardiogram). An ECG records your heartbeat patterns at the time the test is performed. You may also have other tests, such as:  Transthoracic echocardiogram (TTE). During echocardiography, sound waves are used to create a picture of all of the heart structures and to look at how blood flows through your heart.  Transesophageal echocardiogram (TEE).This is a more advanced imaging test that obtains images from inside your body. It allows your health care provider to see your heart in finer detail.  Cardiac monitoring. This allows your health care provider to monitor your heart rate and rhythm in real time.  Holter monitor. This is a portable device that records your heartbeat and can help to diagnose abnormal heartbeats. It allows your health care provider to track your heart activity for several days, if needed.  Stress tests. These can be done through exercise or by taking medicine that makes your heart beat  more quickly.  Blood tests.  Imaging tests. TREATMENT  Your treatment depends on what is causing your chest pain. Treatment may include:  Medicines. These may  include:  Acid blockers for heartburn.  Anti-inflammatory medicine.  Pain medicine for inflammatory conditions.  Antibiotic medicine, if an infection is present.  Medicines to dissolve blood clots.  Medicines to treat coronary artery disease.  Supportive care for conditions that do not require medicines. This may include:  Resting.  Applying heat or cold packs to injured areas.  Limiting activities until pain decreases. HOME CARE INSTRUCTIONS  If you were prescribed an antibiotic medicine, finish it all even if you start to feel better.  Avoid any activities that bring on chest pain.  Do not use any tobacco products, including cigarettes, chewing tobacco, or electronic cigarettes. If you need help quitting, ask your health care provider.  Do not drink alcohol.  Take medicines only as directed by your health care provider.  Keep all follow-up visits as directed by your health care provider. This is important. This includes any further testing if your chest pain does not go away.  If heartburn is the cause for your chest pain, you may be told to keep your head raised (elevated) while sleeping. This reduces the chance that acid will go from your stomach into your esophagus.  Make lifestyle changes as directed by your health care provider. These may include:  Getting regular exercise. Ask your health care provider to suggest some activities that are safe for you.  Eating a heart-healthy diet. A registered dietitian can help you to learn healthy eating options.  Maintaining a healthy weight.  Managing diabetes, if necessary.  Reducing stress. SEEK MEDICAL CARE IF:  Your chest pain does not go away after treatment.  You have a rash with blisters on your chest.  You have a fever. SEEK IMMEDIATE MEDICAL  CARE IF:   Your chest pain is worse.  You have an increasing cough, or you cough up blood.  You have severe abdominal pain.  You have severe weakness.  You faint.  You have chills.  You have sudden, unexplained chest discomfort.  You have sudden, unexplained discomfort in your arms, back, neck, or jaw.  You have shortness of breath at any time.  You suddenly start to sweat, or your skin gets clammy.  You feel nauseous or you vomit.  You suddenly feel light-headed or dizzy.  Your heart begins to beat quickly, or it feels like it is skipping beats. These symptoms may represent a serious problem that is an emergency. Do not wait to see if the symptoms will go away. Get medical help right away. Call your local emergency services (911 in the [place] Do not drive yourself to the hospital.   This information is not intended to replace advice given to you by your health care provider. Make sure you discuss any questions you have with your health care provider.   Document Released: 11/29/2004 Document Revised: 03/12/2014 Document Reviewed: 09/25/2013 Elsevier Interactive Patient Education [employer].     I personally performed the services described in this documentation, which was scribed in my presence. The recorded information has been reviewed and considered, and addended by me as needed.

## 2015-12-20 ENCOUNTER — Other Ambulatory Visit: Payer: Self-pay | Admitting: Family Medicine

## 2015-12-20 ENCOUNTER — Ambulatory Visit
Admission: RE | Admit: 2015-12-20 | Discharge: 2015-12-20 | Disposition: A | Discharge status: Home / Self Care | Payer: BC Managed Care – PPO | Source: Ambulatory Visit | Attending: Family Medicine | Admitting: Family Medicine

## 2015-12-20 DIAGNOSIS — R52 Pain, unspecified: Secondary | ICD-10-CM

## 2015-12-20 DIAGNOSIS — W19XXXA Unspecified fall, initial encounter: Secondary | ICD-10-CM

## 2016-05-22 ENCOUNTER — Other Ambulatory Visit: Payer: Self-pay | Admitting: Orthopedic Surgery

## 2016-07-03 ENCOUNTER — Encounter (HOSPITAL_BASED_OUTPATIENT_CLINIC_OR_DEPARTMENT_OTHER): Payer: Self-pay | Admitting: *Deleted

## 2016-07-09 ENCOUNTER — Encounter (HOSPITAL_BASED_OUTPATIENT_CLINIC_OR_DEPARTMENT_OTHER): Payer: Self-pay | Admitting: Anesthesiology

## 2016-07-10 ENCOUNTER — Ambulatory Visit (HOSPITAL_BASED_OUTPATIENT_CLINIC_OR_DEPARTMENT_OTHER): Payer: BC Managed Care – PPO | Admitting: Anesthesiology

## 2016-07-10 ENCOUNTER — Ambulatory Visit (HOSPITAL_BASED_OUTPATIENT_CLINIC_OR_DEPARTMENT_OTHER)
Admission: RE | Admit: 2016-07-10 | Discharge: 2016-07-10 | Disposition: A | Discharge status: Home / Self Care | Payer: BC Managed Care – PPO | Source: Ambulatory Visit | Attending: Orthopedic Surgery | Admitting: Orthopedic Surgery

## 2016-07-10 ENCOUNTER — Encounter (HOSPITAL_BASED_OUTPATIENT_CLINIC_OR_DEPARTMENT_OTHER)
Admission: RE | Disposition: A | Discharge status: Home / Self Care | Payer: Self-pay | Source: Ambulatory Visit | Attending: Orthopedic Surgery

## 2016-07-10 ENCOUNTER — Encounter (HOSPITAL_BASED_OUTPATIENT_CLINIC_OR_DEPARTMENT_OTHER): Payer: Self-pay | Admitting: *Deleted

## 2016-07-10 DIAGNOSIS — Z87891 Personal history of nicotine dependence: Secondary | ICD-10-CM | POA: Diagnosis not present

## 2016-07-10 DIAGNOSIS — M25841 Other specified joint disorders, right hand: Secondary | ICD-10-CM | POA: Diagnosis present

## 2016-07-10 DIAGNOSIS — Z79899 Other long term (current) drug therapy: Secondary | ICD-10-CM | POA: Insufficient documentation

## 2016-07-10 DIAGNOSIS — E039 Hypothyroidism, unspecified: Secondary | ICD-10-CM | POA: Diagnosis not present

## 2016-07-10 DIAGNOSIS — M19041 Primary osteoarthritis, right hand: Secondary | ICD-10-CM | POA: Diagnosis not present

## 2016-07-10 HISTORY — DX: Hypothyroidism, unspecified: E03.9

## 2016-07-10 HISTORY — PX: MASS EXCISION: SHX2000

## 2016-07-10 HISTORY — PX: INCISION AND DRAINAGE OF WOUND: SHX1803

## 2016-07-10 SURGERY — EXCISION MASS
Anesthesia: Regional | Site: Finger | Laterality: Right

## 2016-07-10 MED ORDER — FENTANYL CITRATE (PF) 100 MCG/2ML IJ SOLN
INTRAMUSCULAR | Status: AC
  Filled 2016-07-10: qty 2

## 2016-07-10 MED ORDER — LIDOCAINE HCL (PF) 0.5 % IJ SOLN
INTRAMUSCULAR | Status: DC | PRN
  Administered 2016-07-10: 30 mL via INTRAVENOUS

## 2016-07-10 MED ORDER — PROMETHAZINE HCL 25 MG/ML IJ SOLN: 6.2500 mg | INTRAMUSCULAR | Status: DC | PRN

## 2016-07-10 MED ORDER — CEFAZOLIN SODIUM-DEXTROSE 2-4 GM/100ML-% IV SOLN: 2.0000 g | INTRAVENOUS | Status: DC

## 2016-07-10 MED ORDER — MIDAZOLAM HCL 2 MG/2ML IJ SOLN
INTRAMUSCULAR | Status: AC
  Filled 2016-07-10: qty 2

## 2016-07-10 MED ORDER — SCOPOLAMINE 1 MG/3DAYS TD PT72: 1.0000 | MEDICATED_PATCH | Freq: Once | TRANSDERMAL | Status: DC | PRN

## 2016-07-10 MED ORDER — FENTANYL CITRATE (PF) 100 MCG/2ML IJ SOLN: 50.0000 ug | INTRAMUSCULAR | Status: DC | PRN

## 2016-07-10 MED ORDER — FENTANYL CITRATE (PF) 100 MCG/2ML IJ SOLN
INTRAMUSCULAR | Status: DC | PRN
  Administered 2016-07-10: 100 ug via INTRAVENOUS

## 2016-07-10 MED ORDER — MIDAZOLAM HCL 2 MG/2ML IJ SOLN: 1.0000 mg | INTRAMUSCULAR | Status: DC | PRN

## 2016-07-10 MED ORDER — BUPIVACAINE HCL (PF) 0.25 % IJ SOLN
INTRAMUSCULAR | Status: DC | PRN
  Administered 2016-07-10: 7 mL

## 2016-07-10 MED ORDER — MIDAZOLAM HCL 5 MG/5ML IJ SOLN
INTRAMUSCULAR | Status: DC | PRN
  Administered 2016-07-10: 2 mg via INTRAVENOUS

## 2016-07-10 MED ORDER — LACTATED RINGERS IV SOLN
INTRAVENOUS | Status: DC
  Administered 2016-07-10 (×2): via INTRAVENOUS

## 2016-07-10 MED ORDER — CEFAZOLIN SODIUM-DEXTROSE 2-4 GM/100ML-% IV SOLN
INTRAVENOUS | Status: AC
  Filled 2016-07-10: qty 100

## 2016-07-10 MED ORDER — ONDANSETRON HCL 4 MG/2ML IJ SOLN
INTRAMUSCULAR | Status: DC | PRN
Start: 2016-07-10 — End: 2016-07-10
  Administered 2016-07-10: 4 mg via INTRAVENOUS

## 2016-07-10 MED ORDER — LIDOCAINE 2% (20 MG/ML) 5 ML SYRINGE
INTRAMUSCULAR | Status: AC
  Filled 2016-07-10: qty 5

## 2016-07-10 MED ORDER — PROPOFOL 10 MG/ML IV BOLUS
INTRAVENOUS | Status: AC
  Filled 2016-07-10: qty 20

## 2016-07-10 MED ORDER — CHLORHEXIDINE GLUCONATE 4 % EX LIQD: 60.0000 mL | Freq: Once | CUTANEOUS | Status: DC

## 2016-07-10 MED ORDER — ONDANSETRON HCL 4 MG/2ML IJ SOLN
INTRAMUSCULAR | Status: AC
  Filled 2016-07-10: qty 2

## 2016-07-10 MED ORDER — HYDROCODONE-ACETAMINOPHEN 5-325 MG PO TABS: 1.0000 | ORAL_TABLET | Freq: Four times a day (QID) | ORAL | 0 refills | Status: AC | PRN

## 2016-07-10 MED ORDER — OXYCODONE HCL 5 MG PO TABS: 5.0000 mg | ORAL_TABLET | Freq: Once | ORAL | Status: DC | PRN

## 2016-07-10 MED ORDER — HYDROMORPHONE HCL 1 MG/ML IJ SOLN: 0.2500 mg | INTRAMUSCULAR | Status: DC | PRN

## 2016-07-10 MED ORDER — OXYCODONE HCL 5 MG/5ML PO SOLN: 5.0000 mg | Freq: Once | ORAL | Status: DC | PRN

## 2016-07-10 MED ORDER — LIDOCAINE HCL (CARDIAC) 20 MG/ML IV SOLN
INTRAVENOUS | Status: DC | PRN
  Administered 2016-07-10: 10 mg via INTRAVENOUS

## 2016-07-10 SURGICAL SUPPLY — 55 items
BAG DECANTER FOR FLEXI CONT (MISCELLANEOUS) IMPLANT
BANDAGE COBAN STERILE 2 (GAUZE/BANDAGES/DRESSINGS) IMPLANT
BLADE MINI RND TIP GREEN BEAV (BLADE) IMPLANT
BLADE SURG 15 STRL LF DISP TIS (BLADE) ×1 IMPLANT
BLADE SURG 15 STRL SS (BLADE) ×2
BNDG CMPR 9X4 STRL LF SNTH (GAUZE/BANDAGES/DRESSINGS)
BNDG COHESIVE 1X5 TAN STRL LF (GAUZE/BANDAGES/DRESSINGS) ×1 IMPLANT
BNDG COHESIVE 3X5 TAN STRL LF (GAUZE/BANDAGES/DRESSINGS) IMPLANT
BNDG ESMARK 4X9 LF (GAUZE/BANDAGES/DRESSINGS) IMPLANT
BNDG GAUZE ELAST 4 BULKY (GAUZE/BANDAGES/DRESSINGS) IMPLANT
CHLORAPREP W/TINT 26ML (MISCELLANEOUS) ×2 IMPLANT
CORDS BIPOLAR (ELECTRODE) ×2 IMPLANT
COVER BACK TABLE 60X90IN (DRAPES) ×2 IMPLANT
COVER MAYO STAND STRL (DRAPES) ×2 IMPLANT
CUFF TOURNIQUET SINGLE 18IN (TOURNIQUET CUFF) ×1 IMPLANT
DECANTER SPIKE VIAL GLASS SM (MISCELLANEOUS) IMPLANT
DRAIN PENROSE 1/2X12 LTX STRL (WOUND CARE) IMPLANT
DRAPE EXTREMITY T 121X128X90 (DRAPE) ×2 IMPLANT
DRAPE SURG 17X23 STRL (DRAPES) ×2 IMPLANT
GAUZE PACKING IODOFORM 1/4X15 (GAUZE/BANDAGES/DRESSINGS) IMPLANT
GAUZE SPONGE 4X4 12PLY STRL (GAUZE/BANDAGES/DRESSINGS) ×2 IMPLANT
GAUZE XEROFORM 1X8 LF (GAUZE/BANDAGES/DRESSINGS) ×2 IMPLANT
GLOVE BIO SURGEON STRL SZ 6.5 (GLOVE) ×1 IMPLANT
GLOVE BIOGEL PI IND STRL 7.0 (GLOVE) IMPLANT
GLOVE BIOGEL PI IND STRL 8.5 (GLOVE) ×1 IMPLANT
GLOVE BIOGEL PI INDICATOR 7.0 (GLOVE) ×2
GLOVE BIOGEL PI INDICATOR 8.5 (GLOVE) ×1
GLOVE SURG ORTHO 8.0 STRL STRW (GLOVE) ×2 IMPLANT
GOWN STRL REUS W/ TWL LRG LVL3 (GOWN DISPOSABLE) ×1 IMPLANT
GOWN STRL REUS W/TWL LRG LVL3 (GOWN DISPOSABLE) ×2
GOWN STRL REUS W/TWL XL LVL3 (GOWN DISPOSABLE) ×2 IMPLANT
LOOP VESSEL MAXI BLUE (MISCELLANEOUS) IMPLANT
NDL PRECISIONGLIDE 27X1.5 (NEEDLE) ×1 IMPLANT
NDL SAFETY ECLIPSE 18X1.5 (NEEDLE) IMPLANT
NEEDLE HYPO 18GX1.5 SHARP (NEEDLE)
NEEDLE PRECISIONGLIDE 27X1.5 (NEEDLE) ×2 IMPLANT
NS IRRIG 1000ML POUR BTL (IV SOLUTION) ×2 IMPLANT
PACK BASIN DAY SURGERY FS (CUSTOM PROCEDURE TRAY) ×2 IMPLANT
PAD CAST 3X4 CTTN HI CHSV (CAST SUPPLIES) IMPLANT
PADDING CAST ABS 4INX4YD NS (CAST SUPPLIES) ×1
PADDING CAST ABS COTTON 4X4 ST (CAST SUPPLIES) ×1 IMPLANT
PADDING CAST COTTON 3X4 STRL (CAST SUPPLIES)
SPLINT FINGER 3.25 BULB 911905 (SOFTGOODS) ×1 IMPLANT
SPLINT PLASTER CAST XFAST 3X15 (CAST SUPPLIES) IMPLANT
SPLINT PLASTER XTRA FASTSET 3X (CAST SUPPLIES)
STOCKINETTE 4X48 STRL (DRAPES) ×2 IMPLANT
SUT ETHILON 4 0 PS 2 18 (SUTURE) ×2 IMPLANT
SUT VIC AB 4-0 P2 18 (SUTURE) IMPLANT
SWAB COLLECTION DEVICE MRSA (MISCELLANEOUS) IMPLANT
SWAB CULTURE ESWAB REG 1ML (MISCELLANEOUS) IMPLANT
SYR BULB 3OZ (MISCELLANEOUS) ×2 IMPLANT
SYR CONTROL 10ML LL (SYRINGE) ×2 IMPLANT
TOWEL OR 17X24 6PK STRL BLUE (TOWEL DISPOSABLE) ×4 IMPLANT
TUBE FEEDING ENTERAL 5FR 16IN (TUBING) IMPLANT
UNDERPAD 30X30 (UNDERPADS AND DIAPERS) ×2 IMPLANT

## 2016-07-10 NOTE — Discharge Instructions (Addendum)

## 2016-07-10 NOTE — Op Note (Signed)
Dictation Number [id number]

## 2016-07-10 NOTE — H&P (Signed)
Brandy Brooks is an 66 y.o. female.   Chief Complaint: mass right small inger HPI: Brandy Brooks is a 66 year old right-hand-dominant female referred by Dr. hand for consultation regarding a mass in the right small finger dorsal aspect radial side. She states it began in Thanksgiving. She complains of an aching type pain with a VAS score of 4/10. She has a history of injury. She has not taken anything for this. He has no increasing or decreasing activities which seems to aggravate this for her. Discomfort is present on a regular basis. She has a history of thyroid problems no history of diabetes arthritis or gout. Family history is positive diabetes thyroid problems negative for arthritis and gout. She has been tested for diabetes.      Past Medical History:  Diagnosis Date  . Allergy   . Anxiety   . Cancer ([place])    skin cancer  . Depression   . Hypothyroidism   . Thyroid disease     Past Surgical History:  Procedure Laterality Date  . COLON SURGERY    . HERNIA REPAIR      Family History  Problem Relation Age of Onset  . Cancer Mother   . Diabetes Mother   . Hyperlipidemia Mother   . Hypertension Mother   . Mental illness Mother   . Diabetes Father   . Hyperlipidemia Father   . Hypertension Father   . Diabetes Sister   . Hyperlipidemia Sister   . Mental illness Sister   . Diabetes Brother   . Hyperlipidemia Brother   . Hypertension Brother   . Mental illness Brother    Social History:  reports that she has never smoked. She has never used smokeless tobacco. She reports that she drinks alcohol. She reports that she does not use drugs.  Allergies:  Allergies  Allergen Reactions  . Other Photosensitivity    Pt. Is sensitive to steroids.     No prescriptions prior to admission.    No results found for this or any previous visit (from the past 48 hour(s)).  No results found.   Pertinent items are noted in HPI.  Height 5\' 5"  (1.651 m), weight 79.8 kg (176  lb).  General appearance: alert, cooperative and appears stated age Head: Normocephalic, without obvious abnormality Neck: no JVD Resp: clear to auscultation bilaterally Cardio: regular rate and rhythm, S1, S2 normal, no murmur, click, rub or gallop GI: soft, non-tender; bowel sounds normal; no masses,  no organomegaly Extremities: mass right small finger DIP joint Pulses: 2+ and symmetric Skin: Skin color, texture, turgor normal. No rashes or lesions Neurologic: Grossly normal Incision/Wound: na  Assessment/Plan Assessment:   Osteoarthritis of finger of right hand   Mucoid cyst, joint    Plan: I discussedwith her the etiology of mucoid cyst. She is aware that is due to the degenerative arthritis beneath it. Dr. Fransico Brooks notes are reviewed. We have discussed possibility of excision of the cyst. Pre-peri-and postoperative course are discussed along with risks and complications. She is aware that there is no guarantee to the surgery the possibility of infection recurrence injury to arteries nerves tendons incomplete relief symptoms and dystrophy. Like to proceed and scheduled for excision of mucoid cyst debridement distal interphalangeal joint right small finger as an outpatient under regional anesthesia.      Brandy Brooks 07/10/2016, 5:09 AM

## 2016-07-10 NOTE — Transfer of Care (Signed)
Immediate Anesthesia Transfer of Care Note  Patient: Brandy Brooks  Procedure(s) Performed: Procedure(s) with comments: EXCISION CYST RIGHT SMALL FINGER (Right) - small finger DEBRIDEMENT DISTAL INTERPHALANGEAL JOINT (Right) - small finger  Patient Location: PACU  Anesthesia Type:Bier block  Level of Consciousness: awake  Airway & Oxygen Therapy: Patient Spontanous Breathing and Patient connected to face mask oxygen  Post-op Assessment: Report given to RN and Post -op Vital signs reviewed and stable  Post vital signs: Reviewed and stable  Last Vitals:  Vitals:   07/10/16 0858  BP: 111/74  Pulse: 65  Resp: 18  Temp: 36.6 C    Last Pain:  Vitals:   07/10/16 0858  TempSrc: Oral  PainSc: 0-No pain      Patients Stated Pain Goal: 3 (79/48/01 6553)  Complications: No apparent anesthesia complications

## 2016-07-10 NOTE — Anesthesia Procedure Notes (Signed)
Anesthesia Regional Block: Bier block (IV Regional)   Pre-Anesthetic Checklist: ,, timeout performed, Correct Patient, Correct Site, Correct Laterality, Correct Procedure, Correct Position, site marked, Risks and benefits discussed,  Surgical consent,  Pre-op evaluation,  At surgeon's request and post-op pain management  Laterality: Right  Prep: chloraprep        Narrative:  Start time: 07/10/2016 9:35 AM Injection made incrementally with aspirations every 30 mL.  Events:,,,,,,,,,,, other event

## 2016-07-10 NOTE — Anesthesia Preprocedure Evaluation (Signed)
Anesthesia Evaluation  Patient identified by MRN, date of birth, ID band Patient awake    Reviewed: Allergy & Precautions, NPO status , Patient's Chart, lab work & pertinent test results  Airway Mallampati: II  TM Distance: >3 FB Neck ROM: Full    Dental no notable dental hx.    Pulmonary neg pulmonary ROS, former smoker,    Pulmonary exam normal breath sounds clear to auscultation       Cardiovascular negative cardio ROS Normal cardiovascular exam Rhythm:Regular Rate:Normal     Neuro/Psych negative neurological ROS  negative psych ROS   GI/Hepatic negative GI ROS, Neg liver ROS,   Endo/Other  negative endocrine ROSHypothyroidism   Renal/GU negative Renal ROS  negative genitourinary   Musculoskeletal negative musculoskeletal ROS (+)   Abdominal   Peds negative pediatric ROS (+)  Hematology negative hematology ROS (+)   Anesthesia Other Findings   Reproductive/Obstetrics negative OB ROS                             Anesthesia Physical Anesthesia Plan  ASA: II  Anesthesia Plan: Bier Block   Post-op Pain Management:    Induction: Intravenous  Airway Management Planned: Nasal Cannula  Additional Equipment:   Intra-op Plan:   Post-operative Plan:   Informed Consent: I have reviewed the patients History and Physical, chart, labs and discussed the procedure including the risks, benefits and alternatives for the proposed anesthesia with the patient or authorized representative who has indicated his/her understanding and acceptance.   Dental advisory given  Plan Discussed with: CRNA  Anesthesia Plan Comments:         Anesthesia Quick Evaluation

## 2016-07-10 NOTE — Anesthesia Postprocedure Evaluation (Signed)
Anesthesia Post Note  Patient: Brandy Brooks  Procedure(s) Performed: Procedure(s) (LRB): EXCISION CYST RIGHT SMALL FINGER (Right) DEBRIDEMENT DISTAL INTERPHALANGEAL JOINT (Right)  Patient location during evaluation: PACU Anesthesia Type: Bier Block Level of consciousness: awake and alert Pain management: pain level controlled Vital Signs Assessment: post-procedure vital signs reviewed and stable Respiratory status: spontaneous breathing, nonlabored ventilation and respiratory function stable Cardiovascular status: stable and blood pressure returned to baseline Anesthetic complications: no       Last Vitals:  Vitals:   07/10/16 1000 07/10/16 1015  BP: (!) 129/98 132/76  Pulse: (!) 57 (!) 55  Resp: 15 16  Temp: 36.5 C     Last Pain:  Vitals:   07/10/16 1015  TempSrc:   PainSc: 0-No pain                 Lynda Rainwater

## 2016-07-10 NOTE — Brief Op Note (Signed)
07/10/2016  10:05 AM  PATIENT:  Brandy Brooks  66 y.o. female  PRE-OPERATIVE DIAGNOSIS:  MUCOID TUMOR RIGHT SMALL FINGER, DEGENERATIVE ARTHRITIS DISTAL INTERPHALANGEAL JOINT  POST-OPERATIVE DIAGNOSIS:  MUCOID TUMOR RIGHT SMALL FINGER, DEGENERATIVE ARTHRITIS DISTAL INTERPHALANGEAL JOINT  PROCEDURE:  Procedure(s) with comments: EXCISION CYST RIGHT SMALL FINGER (Right) - small finger DEBRIDEMENT DISTAL INTERPHALANGEAL JOINT (Right) - small finger  SURGEON:  Surgeon(s) and Role:    Daryll Brod, MD - Primary  PHYSICIAN ASSISTANT:   ASSISTANTS: none   ANESTHESIA:   local and regional  EBL:  Total I/O In: 800 [I.V.:800] Out: 1 [Blood:1]  BLOOD ADMINISTERED:none  DRAINS: none   LOCAL MEDICATIONS USED:  BUPIVICAINE   SPECIMEN:  Excision  DISPOSITION OF SPECIMEN:  PATHOLOGY  COUNTS:  YES  TOURNIQUET:   Total Tourniquet Time Documented: Forearm (Right) - 23 minutes Total: Forearm (Right) - 23 minutes   DICTATION: .Other Dictation: Dictation Number [id number]  PLAN OF CARE: Discharge to home after PACU  PATIENT DISPOSITION:  PACU - hemodynamically stable.

## 2016-07-10 NOTE — Op Note (Signed)
[name], Brandy Brooks NO.:  Brooks  MEDICAL RECORD NO.:  [id number]  LOCATION:                                 FACILITY:  PHYSICIAN:  Daryll Brod, M.D.            DATE OF BIRTH:  DATE OF PROCEDURE:  07/10/2016 DATE OF DISCHARGE:                              OPERATIVE REPORT   PREOPERATIVE DIAGNOSIS:  Mucoid cyst with degenerative arthritis, distal interphalangeal joint, right small finger.  POSTOPERATIVE DIAGNOSIS:  Mucoid cyst with degenerative arthritis, distal interphalangeal joint, right small finger.  OPERATION:  Excision of mucoid cyst with debridement distal interphalangeal joint of osteophytes.  SURGEON:  Daryll Brod, M.D.  ASSISTANT:  None.  ANESTHESIA:  Forearm-based IV regional with metacarpal block.  PLACE OF SURGERY:  [facility]  [facility].  HISTORY:  The patient is a 66 year old female with a history of a large mass over the distal interphalangeal joint of her right small finger. This transilluminates.  X-rays revealed degenerative arthritis.  She has elected to have this excised.  Pre, peri, and postoperative course, but discussed along with risks and complications.  She is aware there is no guarantee to the surgery; the possibility of infection; recurrence of injury to arteries, nerves, tendons; incomplete relief of symptoms; dystrophy; the possibility of recurrence due to the arthritis beneath. In the preoperative area, the patient was seen, the extremity marked by both patient and surgeon.  Antibiotic given.  PROCEDURE IN DETAIL:  The patient was brought to the operating room, where a forearm-based IV regional anesthetic was carried out without difficulty under the direction of the Anesthesia Department.  She was prepped using ChloraPrep in the supine position with the right arm free. A 3-minute dry time was allowed, and a time-out taken, confirming the patient and procedure.  A metacarpal block was  then given with 0.25% bupivacaine without epinephrine approximately 7 mL was used.  A curvilinear incision was made over the distal interphalangeal joint not allowing the mid lateral line radially.  This was carried down through subcutaneous tissue.  The cyst was immediately encountered.  With blunt and sharp dissection, this was excised and removed with a small hemostatic rongeur.  An incision was then made on the radial aspect of the extensor tendon.  The joint was opened.  Osteophytes on the middle phalanx were then removed with a House curette and a small rongeur. Specimen was sent to Pathology.  The wound was copiously irrigated with saline.  The skin was closed with interrupted 4-0 nylon sutures.  A sterile compressive dressing and splint to the finger was applied.  On deflation of the tourniquet, remaining fingers pinked.  She was taken to the recovery room for observation in satisfactory condition.  She will be discharged to home to return to the [facility] in 1 week, on Norco.          ______________________________ Daryll Brod, M.D.     GK/MEDQ  D:  07/10/2016  T:  07/10/2016  Job:  [id number]

## 2016-07-11 ENCOUNTER — Encounter (HOSPITAL_BASED_OUTPATIENT_CLINIC_OR_DEPARTMENT_OTHER): Payer: Self-pay | Admitting: Orthopedic Surgery

## 2017-05-03 ENCOUNTER — Other Ambulatory Visit: Payer: Self-pay

## 2017-05-03 ENCOUNTER — Encounter (HOSPITAL_COMMUNITY): Payer: Self-pay

## 2017-05-03 ENCOUNTER — Emergency Department (HOSPITAL_COMMUNITY)
Admission: EM | Admit: 2017-05-03 | Discharge: 2017-05-03 | Disposition: A | Discharge status: Home / Self Care | Payer: BC Managed Care – PPO | Attending: Emergency Medicine | Admitting: Emergency Medicine

## 2017-05-03 DIAGNOSIS — E039 Hypothyroidism, unspecified: Secondary | ICD-10-CM | POA: Insufficient documentation

## 2017-05-03 DIAGNOSIS — Z79899 Other long term (current) drug therapy: Secondary | ICD-10-CM | POA: Insufficient documentation

## 2017-05-03 DIAGNOSIS — Y998 Other external cause status: Secondary | ICD-10-CM | POA: Diagnosis not present

## 2017-05-03 DIAGNOSIS — Y93G1 Activity, food preparation and clean up: Secondary | ICD-10-CM | POA: Diagnosis not present

## 2017-05-03 DIAGNOSIS — Z23 Encounter for immunization: Secondary | ICD-10-CM | POA: Diagnosis not present

## 2017-05-03 DIAGNOSIS — S61011A Laceration without foreign body of right thumb without damage to nail, initial encounter: Secondary | ICD-10-CM | POA: Diagnosis present

## 2017-05-03 DIAGNOSIS — Z87891 Personal history of nicotine dependence: Secondary | ICD-10-CM | POA: Diagnosis not present

## 2017-05-03 DIAGNOSIS — Y929 Unspecified place or not applicable: Secondary | ICD-10-CM | POA: Insufficient documentation

## 2017-05-03 DIAGNOSIS — W268XXA Contact with other sharp object(s), not elsewhere classified, initial encounter: Secondary | ICD-10-CM | POA: Diagnosis not present

## 2017-05-03 MED ORDER — CEPHALEXIN 500 MG PO CAPS: 500.0000 mg | ORAL_CAPSULE | Freq: Four times a day (QID) | ORAL | 0 refills | Status: AC

## 2017-05-03 MED ORDER — TETANUS-DIPHTH-ACELL PERTUSSIS 5-2.5-18.5 LF-MCG/0.5 IM SUSP
0.5000 mL | Freq: Once | INTRAMUSCULAR | Status: AC
  Administered 2017-05-03: 0.5 mL via INTRAMUSCULAR
  Filled 2017-05-03: qty 0.5

## 2017-05-03 NOTE — ED Provider Notes (Signed)
Patient placed in Quick Look pathway, seen and evaluated   Chief Complaint: laceration  HPI:   Right thumb pad laceration sustained PTA with some kitchen gadget. She is RHD. Last tetanus unknown.   ROS: No numbness or paresthesias.   Physical Exam:   Gen: No distress  Neuro: Awake and Alert  Skin: Warm    Focused Exam: 1.5 cm avulsion across lateral aspect of thumb pad, no involvement of nail. Oozing blood without pulsatile bleeding.   Initiation of care has begun. The patient has been counseled on the process, plan, and necessity for staying for the completion/evaluation, and the remainder of the medical screening examination. Superficial injury, imaging not indicated. Doubt this will need suture repair. Will clean and obtain hemostasis with quick clock and pressure dressing. Tdap ordered.    Kinnie Feil, PA-C 05/03/17 1940    Hayden Rasmussen, MD 05/04/17 [phone]

## 2017-05-03 NOTE — ED Notes (Signed)
Patient came back to triage after bleeding started again in the waiting room.  Wound assessed and pressure applied.  Non approximated edges.  When pressure released wound began to bleed again.  Spoke with C. Gibbions PA-C and quick clot applied to finger.

## 2017-05-03 NOTE — ED Provider Notes (Signed)
[facility] EMERGENCY DEPARTMENT Provider Note   CSN: [id number] Arrival date & time: 05/03/17  1857     History   Chief Complaint Chief Complaint  Patient presents with  . laceration    HPI Brandy Brooks is a 67 y.o. female with a history of anxiety, hypothyroid who presents today for evaluation of a avulsion to her right thumb.  She reports that she was trying out a new mandolin slicing sweet potatoes when she accidentally sliced her right thumb.  She reports that it was briskly bleeding.  Last tetanus is unknown.  She is right-hand dominant.  She does not take any blood thinners.  She denies being immunosuppressed, is not diabetic.  HPI  Past Medical History:  Diagnosis Date  . Allergy   . Anxiety   . Cancer ([place])    skin cancer  . Depression   . Hypothyroidism   . Thyroid disease     Patient Active Problem List   Diagnosis Date Noted  . Hypothyroidism 02/08/2014    Past Surgical History:  Procedure Laterality Date  . COLON SURGERY    . HERNIA REPAIR    . INCISION AND DRAINAGE OF WOUND Right 07/10/2016   Procedure: DEBRIDEMENT DISTAL INTERPHALANGEAL JOINT;  Surgeon: Daryll Brod, MD;  Location: [facility];  Service: Orthopedics;  Laterality: Right;  small finger  . MASS EXCISION Right 07/10/2016   Procedure: EXCISION CYST RIGHT SMALL FINGER;  Surgeon: Daryll Brod, MD;  Location: [facility];  Service: Orthopedics;  Laterality: Right;  small finger    OB History    No data available       Home Medications    Prior to Admission medications   Medication Sig Start Date End Date Taking? Authorizing Provider  cephALEXin (KEFLEX) 500 MG capsule Take 1 capsule (500 mg total) by mouth 4 (four) times daily for 7 days. 05/03/17 05/10/17  Lorin Glass, PA-C  HYDROcodone-acetaminophen (NORCO) 5-325 MG tablet Take 1 tablet by mouth every 6 (six) hours as needed for moderate pain. 07/10/16   Daryll Brod, MD    levothyroxine (SYNTHROID, LEVOTHROID) 100 MCG tablet TAKE 1 TABLET (100 MCG TOTAL) BY MOUTH DAILY BEFORE BREAKFAST.  "OV NEEDED FOR ADDITIONAL REFILLS" 04/02/14   Gale Journey Damaris Hippo, PA-C    Family History Family History  Problem Relation Age of Onset  . Cancer Mother   . Diabetes Mother   . Hyperlipidemia Mother   . Hypertension Mother   . Mental illness Mother   . Diabetes Father   . Hyperlipidemia Father   . Hypertension Father   . Diabetes Sister   . Hyperlipidemia Sister   . Mental illness Sister   . Diabetes Brother   . Hyperlipidemia Brother   . Hypertension Brother   . Mental illness Brother     Social History Social History   Tobacco Use  . Smoking status: Former Smoker    Types: Cigarettes    Last attempt to quit: 07/10/1976    Years since quitting: 40.8  . Smokeless tobacco: Never Used  Substance Use Topics  . Alcohol use: Yes    Alcohol/week: 0.0 oz    Comment: 12-15 drinks   . Drug use: No     Allergies   Other   Review of Systems Review of Systems  Constitutional: Negative for chills and fever.  Skin: Positive for wound.  All other systems reviewed and are negative.    Physical Exam Updated Vital Signs BP Marland Kitchen)  142/106   Pulse 78   Temp 97.9 F (36.6 C)   Resp 18   Wt 79.4 kg (175 lb)   SpO2 99%   BMI 29.12 kg/m   Physical Exam  Constitutional: She appears well-developed and well-nourished. No distress.  HENT:  Head: Normocephalic and atraumatic.  Cardiovascular: Intact distal pulses.  Musculoskeletal:  Full AROM of Right thumb.   Neurological:  Sensation intact to right thumb.   Skin: Skin is warm and dry. She is not diaphoretic.  There is a superficial avulsion of the finger pad on the right thumb.  There is no nailbed involvement.  There is no bone involvement, is more superficial.  Distal thumb is well perfused.  The bleeding is controlled at this time.  No joint violation.    Psychiatric: She has a normal mood and affect. Her  behavior is normal.  Nursing note and vitals reviewed.    ED Treatments / Results  Labs (all labs ordered are listed, but only abnormal results are displayed) Labs Reviewed - No data to display  EKG  EKG Interpretation None       Radiology No results found.  Procedures Procedures (including critical care time)  Medications Ordered in ED Medications  Tdap (BOOSTRIX) injection 0.5 mL (0.5 mLs Intramuscular Given 05/03/17 1957)     Initial Impression / Assessment and Plan / ED Course  I have reviewed the triage vital signs and the nursing notes.  Pertinent labs & imaging results that were available during my care of the patient were reviewed by me and considered in my medical decision making (see chart for details).    Brandy Brooks presents today for evaluation of avulsion of skin on the distal right thumb.  There is no skin to suture.  The bleeding was controlled with pressure.  Wound was inspected in a bloodless field without obvious foreign body.  Not deep enough to warrant x-rays.  Tdap updated.  Wound was cleaned and irrigated.  Patient was instructed on proper wound care.  Patient given return precautions, states her understanding.  She is to follow-up with PCP on Monday for evaluation.  As laceration is on a finger, will place patient on Keflex for infection prophylaxis.   Final Clinical Impressions(s) / ED Diagnoses   Final diagnoses:  Laceration of right thumb without foreign body without damage to nail, initial encounter    ED Discharge Orders        Ordered    cephALEXin (KEFLEX) 500 MG capsule  4 times daily     05/03/17 2116       Kaylean, [place], Hershal Coria 05/03/17 2213    Blanchie Dessert, MD 05/04/17 [phone]

## 2017-05-03 NOTE — Discharge Instructions (Signed)
You may change your dressing in 24 hours.  Please follow up with your doctor on Monday.    Please take Ibuprofen (Advil, motrin) and Tylenol (acetaminophen) to relieve your pain.  You may take up to 600 MG (3 pills) of normal strength ibuprofen every 8 hours as needed.  In between doses of ibuprofen you make take tylenol, up to 1,000 mg (two extra strength pills).  Do not take more than 3,000 mg tylenol in a 24 hour period.  Please check all medication labels as many medications such as pain and cold medications may contain tylenol.  Do not drink alcohol while taking these medications.  Do not take other NSAID'S while taking ibuprofen (such as aleve or naproxen).  Please take ibuprofen with food to decrease stomach upset.  While in the ED your blood pressure was high.  Please follow up with your primary care doctor or the wellness clinic for repeat evaluation as you may need medication.  High blood pressure can cause long term, potentially serious, damage if left untreated.   Please do not soak or submerge your hand in water for at least one week. If you must to remove a dressing please use previously boiled water and do not soak for longer than needed.   You may have diarrhea from the antibiotics.  It is very important that you continue to take the antibiotics even if you get diarrhea unless a medical professional tells you that you may stop taking them.  If you stop too early the bacteria you are being treated for will become stronger and you may need different, more powerful antibiotics that have more side effects and worsening diarrhea.  Please stay well hydrated and consider probiotics as they may decrease the severity of your diarrhea.

## 2017-05-03 NOTE — ED Notes (Signed)
Patient is A&Ox4 at this time.  Patient in no signs of distress.  Please see providers note for complete history and physical exam.  

## 2017-05-03 NOTE — ED Notes (Signed)
Patient Alert and oriented to baseline. Stable and ambulatory to baseline. Patient verbalized understanding of the discharge instructions.  Patient belongings were taken by the patient.   

## 2017-05-03 NOTE — ED Triage Notes (Signed)
Pt presents to the ed with complaints of cutting part of her thumb off on a new kitchen gadget. Moderate bleeding. Dressing applied

## 2018-07-31 IMAGING — CR DG KNEE COMPLETE 4+V*R*
4 series · 4 of 4 positions shown · non-contrast
Comparison: None.

CLINICAL DATA: Right knee pain following fall several days ago.
Initial encounter.

EXAM:
RIGHT KNEE - COMPLETE 4+ VIEW

[w knee ap right]
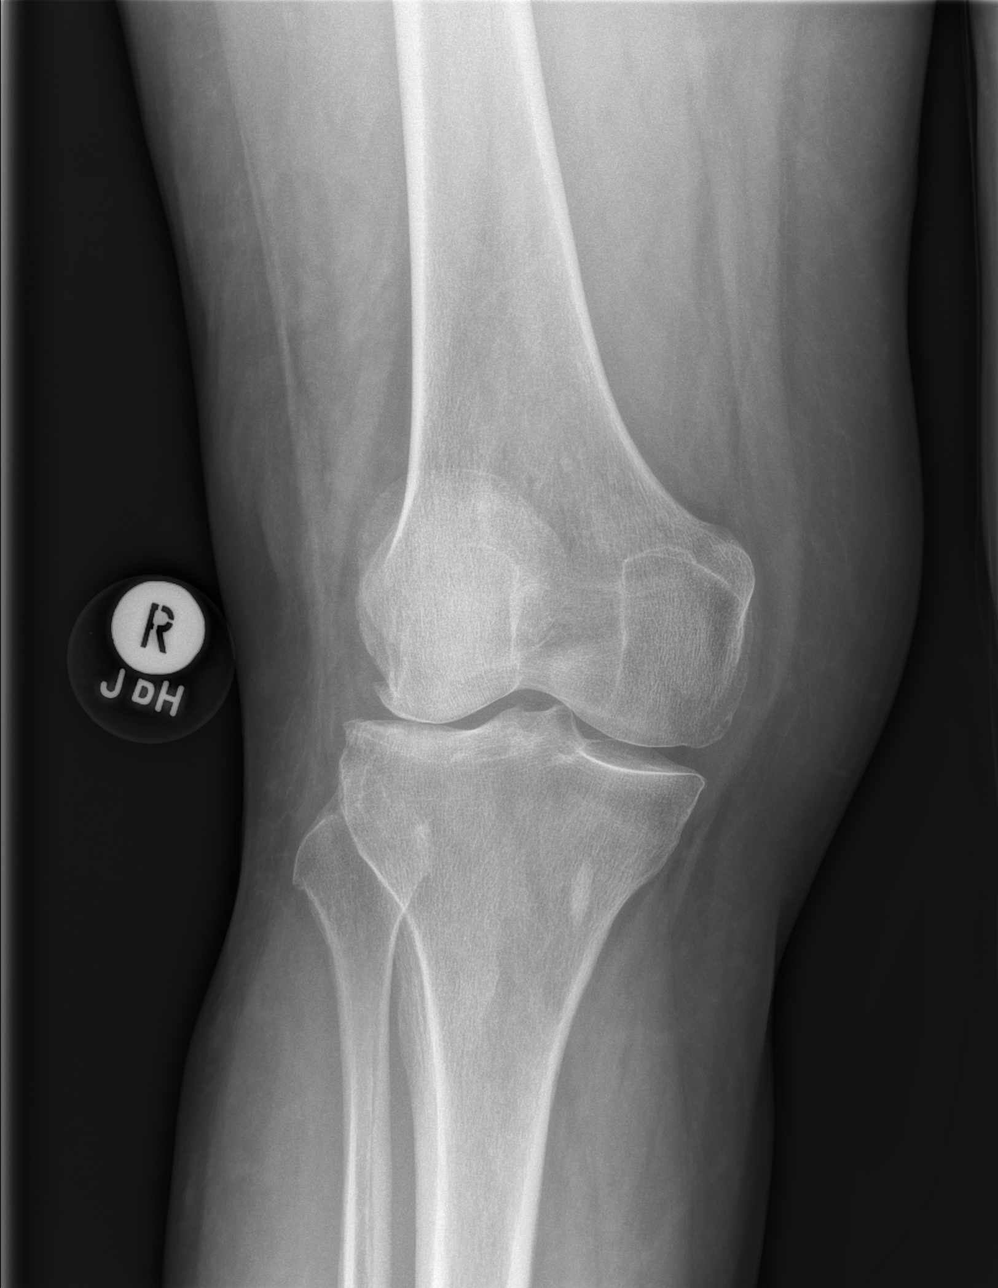

[w knee lat right]
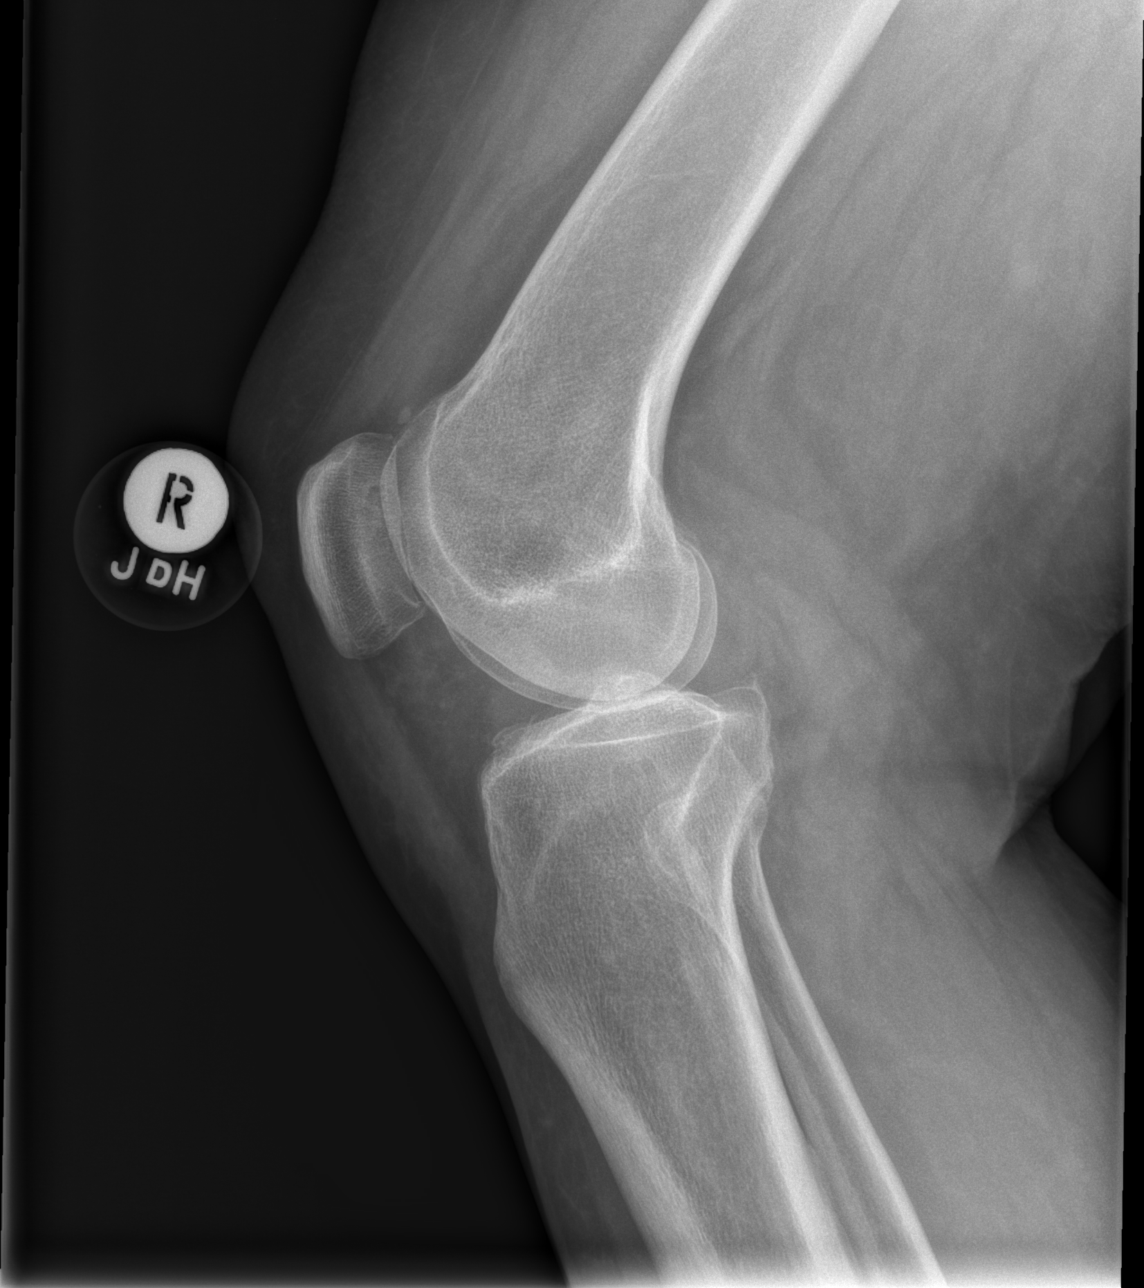

[x knee tunnel right]
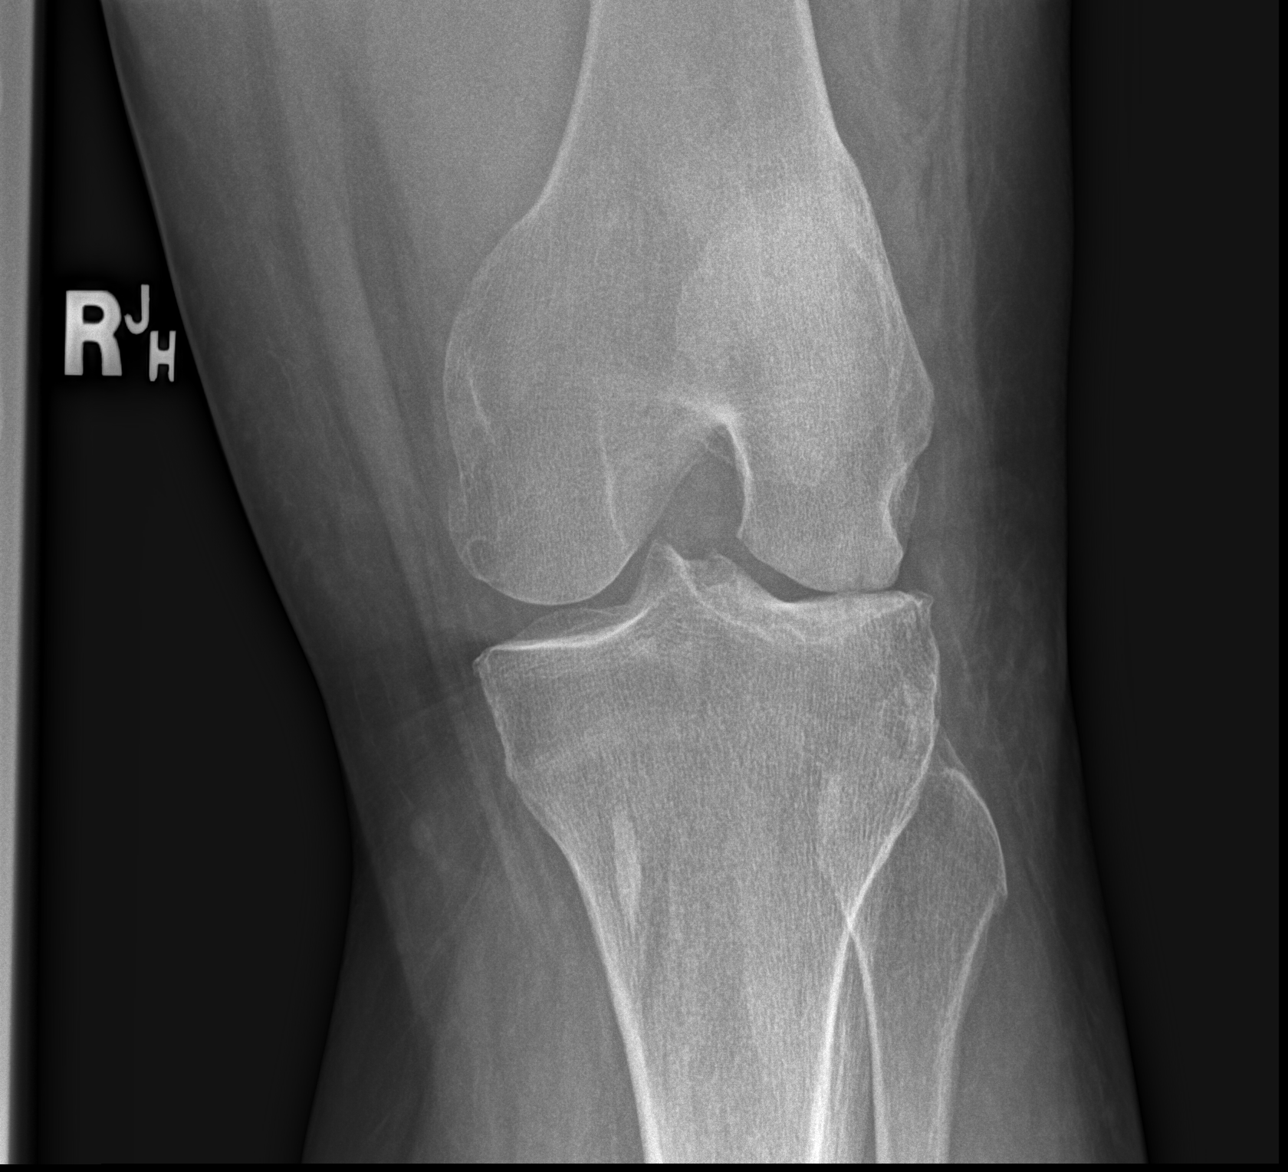

[x knee sunrise right]
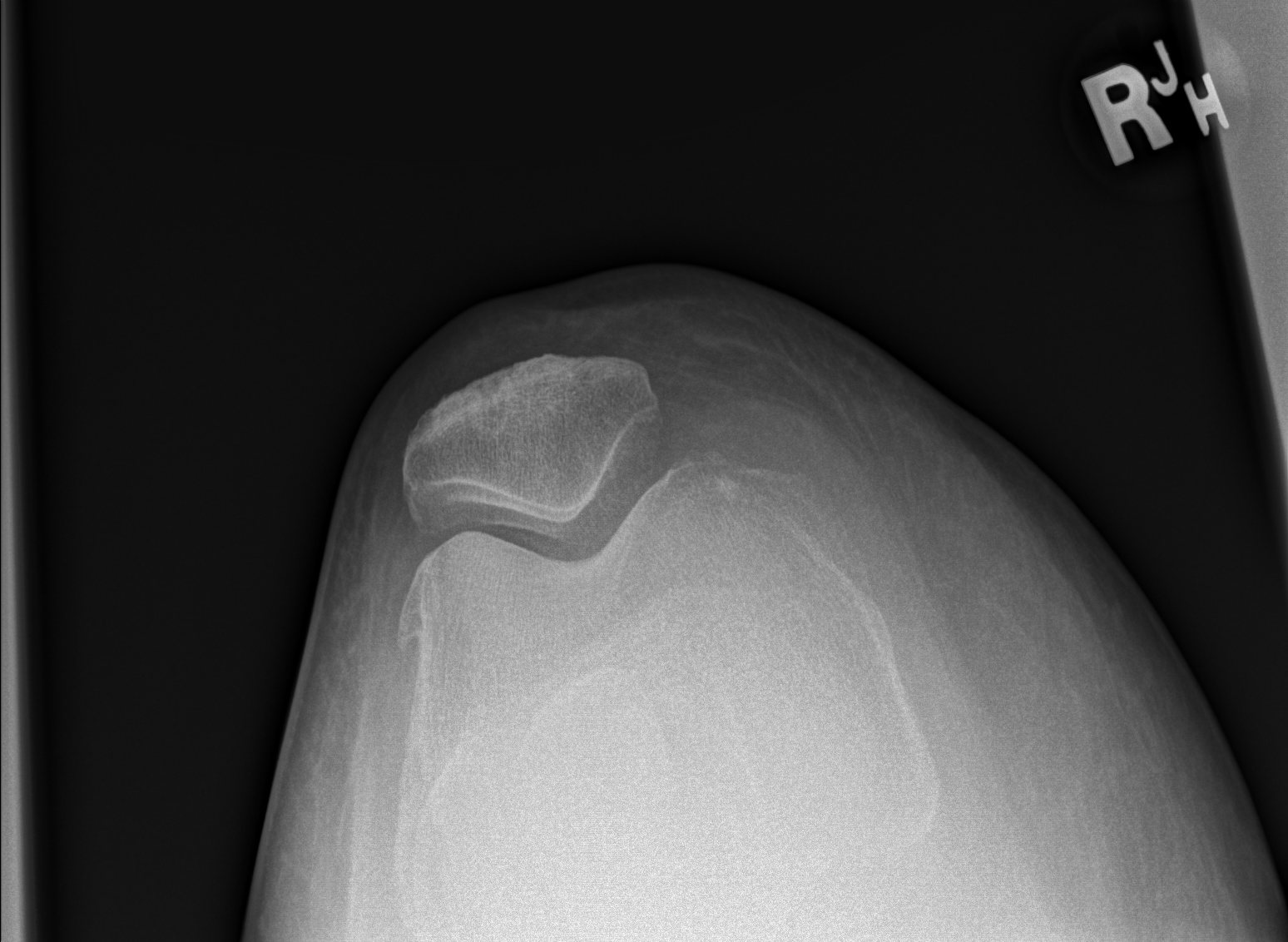

[4 of 4 positions shown; findings below may reference images not displayed]

FINDINGS: There is no evidence of acute fracture, subluxation or dislocation.

There is no evidence of knee effusion.

Moderate degenerative changes in the lateral compartment noted.

No suspicious focal bony lesions are present.
IMPRESSION: No evidence of acute abnormality.

Moderate lateral compartment degenerative changes.

## 2019-03-11 ENCOUNTER — Other Ambulatory Visit: Payer: Self-pay

## 2019-03-11 ENCOUNTER — Ambulatory Visit (HOSPITAL_COMMUNITY)
Admission: EM | Admit: 2019-03-11 | Discharge: 2019-03-11 | Disposition: A | Discharge status: Home / Self Care | Payer: BC Managed Care – PPO | Attending: Family Medicine | Admitting: Family Medicine

## 2019-03-11 ENCOUNTER — Encounter (HOSPITAL_COMMUNITY): Payer: Self-pay

## 2019-03-11 DIAGNOSIS — R002 Palpitations: Secondary | ICD-10-CM

## 2019-03-11 NOTE — ED Triage Notes (Signed)
Pt state she has been feeling palpations. This has lasted 2 hours before she came in today.

## 2019-03-11 NOTE — ED Provider Notes (Signed)
[facility]    CSN: [id number] Arrival date & time: 03/11/19  1752      History   Chief Complaint Chief Complaint  Patient presents with  . palpitations.    HPI Brandy Brooks is a 69 y.o. female.   Patient had onset today of some palpitations.  Not associated with syncope near syncope or chest pain.  Had similar episode 4 years ago and wore a Holter monitor.  Was told she had some premature atrial contractions.  This occurred today as she was watching TV with the Ryan's etc. on TV.  HPI  Past Medical History:  Diagnosis Date  . Allergy   . Anxiety   . Cancer ([place])    skin cancer  . Depression   . Hypothyroidism   . Thyroid disease     Patient Active Problem List   Diagnosis Date Noted  . Hypothyroidism 02/08/2014    Past Surgical History:  Procedure Laterality Date  . COLON SURGERY    . HERNIA REPAIR    . INCISION AND DRAINAGE OF WOUND Right 07/10/2016   Procedure: DEBRIDEMENT DISTAL INTERPHALANGEAL JOINT;  Surgeon: Daryll Brod, MD;  Location: [facility];  Service: Orthopedics;  Laterality: Right;  small finger  . MASS EXCISION Right 07/10/2016   Procedure: EXCISION CYST RIGHT SMALL FINGER;  Surgeon: Daryll Brod, MD;  Location: [facility];  Service: Orthopedics;  Laterality: Right;  small finger    OB History   No obstetric history on file.      Home Medications    Prior to Admission medications   Medication Sig Start Date End Date Taking? Authorizing Provider  HYDROcodone-acetaminophen (NORCO) 5-325 MG tablet Take 1 tablet by mouth every 6 (six) hours as needed for moderate pain. 07/10/16   Daryll Brod, MD  levothyroxine (SYNTHROID, LEVOTHROID) 100 MCG tablet TAKE 1 TABLET (100 MCG TOTAL) BY MOUTH DAILY BEFORE BREAKFAST.  "OV NEEDED FOR ADDITIONAL REFILLS" 04/02/14   Gale Journey Damaris Hippo, PA-C    Family History Family History  Problem Relation Age of Onset  . Cancer Mother   . Diabetes Mother   . Hyperlipidemia  Mother   . Hypertension Mother   . Mental illness Mother   . Diabetes Father   . Hyperlipidemia Father   . Hypertension Father   . Diabetes Sister   . Hyperlipidemia Sister   . Mental illness Sister   . Diabetes Brother   . Hyperlipidemia Brother   . Hypertension Brother   . Mental illness Brother     Social History Social History   Tobacco Use  . Smoking status: Former Smoker    Types: Cigarettes    Quit date: 07/10/1976    Years since quitting: 42.6  . Smokeless tobacco: Never Used  Substance Use Topics  . Alcohol use: Yes    Alcohol/week: 0.0 standard drinks    Comment: 12-15 drinks   . Drug use: No     Allergies   Other   Review of Systems Review of Systems  Respiratory: Negative for chest tightness and shortness of breath.   Cardiovascular: Positive for palpitations.  All other systems reviewed and are negative.    Physical Exam Triage Vital Signs ED Triage Vitals  Enc Vitals Group     BP 03/11/19 1924 127/71     Pulse Rate 03/11/19 1924 88     Resp 03/11/19 1924 16     Temp 03/11/19 1924 98.1 F (36.7 C)     Temp Source  03/11/19 1924 Oral     SpO2 03/11/19 1924 96 %     Weight 03/11/19 1939 161 lb (73 kg)     Height --      Head Circumference --      Peak Flow --      Pain Score 03/11/19 1939 0     Pain Loc --      Pain Edu? --      Excl. in [place]? --    No data found.  Updated Vital Signs BP 127/71 (BP Location: Right Arm)   Pulse 88   Temp 98.1 F (36.7 C) (Oral)   Resp 16   Wt 73 kg   SpO2 96%   BMI 26.79 kg/m   Visual Acuity Right Eye Distance:   Left Eye Distance:   Bilateral Distance:    Right Eye Near:   Left Eye Near:    Bilateral Near:     Physical Exam Vitals and nursing note reviewed.  Constitutional:      Appearance: Normal appearance.  HENT:     Head: Normocephalic.  Cardiovascular:     Rate and Rhythm: Normal rate and regular rhythm.     Pulses: Normal pulses.     Heart sounds: Normal heart sounds. No  murmur.  Pulmonary:     Effort: Pulmonary effort is normal.     Breath sounds: Normal breath sounds.  Neurological:     Mental Status: She is alert.      UC Treatments / Results  Labs (all labs ordered are listed, but only abnormal results are displayed) Labs Reviewed - No data to display  EKG normal sinus rhythm   Radiology No results found.  Procedures Procedures (including critical care time)  Medications Ordered in UC Medications - No data to display  Initial Impression / Assessment and Plan / UC Course  I have reviewed the triage vital signs and the nursing notes.  Pertinent labs & imaging results that were available during my care of the patient were reviewed by me and considered in my medical decision making (see chart for details).     Palpitations.  No evidence of serious arrhythmia.  No atrial fib.  Suspect probable PACs Final Clinical Impressions(s) / UC Diagnoses   Final diagnoses:  Palpitations     Discharge Instructions     Try to reduce caffeine and anxiety Count pulse and no irregularity if palpitations occur again   ED Prescriptions    None     PDMP not reviewed this encounter.   Wardell Honour, MD 03/11/19 2026

## 2019-03-11 NOTE — Discharge Instructions (Signed)
Try to reduce caffeine and anxiety Count pulse and no irregularity if palpitations occur again

## 2019-04-26 ENCOUNTER — Ambulatory Visit: Payer: BC Managed Care – PPO | Attending: Internal Medicine

## 2019-04-26 DIAGNOSIS — Z23 Encounter for immunization: Secondary | ICD-10-CM | POA: Insufficient documentation

## 2019-04-26 NOTE — Progress Notes (Signed)
   Covid-19 Vaccination Clinic  Name:  TAMETHA NOWAK    MRN: [id number] DOB: 11/16/1950  04/26/2019  Ms. Upchurch was observed post Covid-19 immunization for 15 minutes without incidence. She was provided with Vaccine Information Sheet and instruction to access the V-Safe system.   Ms. Nan was instructed to call 911 with any severe reactions post vaccine: Marland Kitchen Difficulty breathing  . Swelling of your face and throat  . A fast heartbeat  . A bad rash all over your body  . Dizziness and weakness    Immunizations Administered    Name Date Dose VIS Date Route   Pfizer COVID-19 Vaccine 04/26/2019  8:38 AM 0.3 mL 02/13/2019 Intramuscular   Manufacturer: [place]   Lot: [id number]   [place]: [id number]

## 2019-05-19 ENCOUNTER — Ambulatory Visit: Payer: BC Managed Care – PPO | Attending: Internal Medicine

## 2019-05-19 DIAGNOSIS — Z23 Encounter for immunization: Secondary | ICD-10-CM

## 2019-05-19 NOTE — Progress Notes (Signed)
   Covid-19 Vaccination Clinic  Name:  Brandy Brooks    MRN: [id number] DOB: [birth year]  05/19/2019  [name] was observed post Covid-19 immunization for 15 minutes without incident. She was provided with Vaccine Information Sheet and instruction to access the V-Safe system.   [name] was instructed to call 911 with any severe reactions post vaccine: Marland Kitchen Difficulty breathing  . Swelling of face and throat  . A fast heartbeat  . A bad rash all over body  . Dizziness and weakness   Immunizations Administered    Name Date Dose VIS Date Route   Pfizer COVID-19 Vaccine 05/19/2019  3:51 PM 0.3 mL 02/13/2019 Intramuscular   Manufacturer: [place]   Lot: [id number]   [place]: [id number]

## 2019-12-01 ENCOUNTER — Ambulatory Visit (INDEPENDENT_AMBULATORY_CARE_PROVIDER_SITE_OTHER): Payer: BC Managed Care – PPO | Admitting: Ophthalmology

## 2019-12-01 ENCOUNTER — Encounter (INDEPENDENT_AMBULATORY_CARE_PROVIDER_SITE_OTHER): Payer: Self-pay | Admitting: Ophthalmology

## 2019-12-01 ENCOUNTER — Other Ambulatory Visit: Payer: Self-pay

## 2019-12-01 DIAGNOSIS — H35372 Puckering of macula, left eye: Secondary | ICD-10-CM | POA: Diagnosis not present

## 2019-12-01 DIAGNOSIS — H35413 Lattice degeneration of retina, bilateral: Secondary | ICD-10-CM | POA: Diagnosis not present

## 2019-12-01 DIAGNOSIS — H2512 Age-related nuclear cataract, left eye: Secondary | ICD-10-CM | POA: Insufficient documentation

## 2019-12-01 DIAGNOSIS — H2513 Age-related nuclear cataract, bilateral: Secondary | ICD-10-CM | POA: Diagnosis not present

## 2019-12-01 DIAGNOSIS — H33321 Round hole, right eye: Secondary | ICD-10-CM | POA: Insufficient documentation

## 2019-12-01 DIAGNOSIS — H33322 Round hole, left eye: Secondary | ICD-10-CM | POA: Diagnosis not present

## 2019-12-01 DIAGNOSIS — H35352 Cystoid macular degeneration, left eye: Secondary | ICD-10-CM

## 2019-12-01 DIAGNOSIS — H35371 Puckering of macula, right eye: Secondary | ICD-10-CM

## 2019-12-01 NOTE — Assessment & Plan Note (Signed)
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  In this patient who has not had cataract surgery I will recommend however consideration of cataract surgical performance So asked to determine whether that visual acuity improvement will prevent the need for surgical removal of the epiretinal membrane.  Splane to the patient that removing the epiretinal membrane first triggers rapid cataract progression in her age group.  As I often last for the "easier" cataract surgery be performed so as to which issue is triggering the most visual acuity impact in her visual system

## 2019-12-01 NOTE — Assessment & Plan Note (Signed)
The nature of cataract was discussed with the patient as well as the elective nature of surgery. The patient was reassured that surgery at a later date does not put the patient at risk for a worse outcome. It was emphasized that the need for surgery is dictated by the patient's quality of life as influenced by the cataract. Patient was instructed to maintain close follow up with their general eye care doctor.  Current cataract in each eye with some refractive impact, a moderate amount of cloudiness and color change.  Nonetheless this may have some visual impact making her current symptoms from epiretinal membrane worse.  If it is for this reason I do typically recommend cataract surgery with intraocular lens implant, initially in the right eye to allow for best clarity for surgical removal of epiretinal membrane only if it is necessary.  Visual acuity does not improve after cataract surgery to a level satisfactory for her visual needs at that time surgical intervention the epiretinal membrane could be undertaken

## 2019-12-01 NOTE — Progress Notes (Signed)
12/01/2019     CHIEF COMPLAINT Patient presents for Retina Follow Up   HISTORY OF PRESENT ILLNESS: Brandy Brooks is a 69 y.o. female who presents to the clinic today for:   HPI    Retina Follow Up    Patient presents with  Retinal Break/Detachment.  In right eye.  Severity is moderate.  Since onset it is stable.  I, the attending physician,  performed the HPI with the patient and updated documentation appropriately.          Comments    Pt referred by Dr. Rexene Edison for Lamellar Hole OD. Oct and FP  Pt feels vision has declined since last visit. Pt saw Dr. Rexene Edison yesterday.       Last edited by Tilda Franco on 12/01/2019  8:10 AM. (History)      Referring physician: No referring provider defined for this encounter.  HISTORICAL INFORMATION:   Selected notes from the MEDICAL RECORD NUMBER       CURRENT MEDICATIONS: No current outpatient medications on file. (Ophthalmic Drugs)   No current facility-administered medications for this visit. (Ophthalmic Drugs)   Current Outpatient Medications (Other)  Medication Sig  . HYDROcodone-acetaminophen (NORCO) 5-325 MG tablet Take 1 tablet by mouth every 6 (six) hours as needed for moderate pain.  Marland Kitchen levothyroxine (SYNTHROID, LEVOTHROID) 100 MCG tablet TAKE 1 TABLET (100 MCG TOTAL) BY MOUTH DAILY BEFORE BREAKFAST.  "OV NEEDED FOR ADDITIONAL REFILLS"   No current facility-administered medications for this visit. (Other)      REVIEW OF SYSTEMS:    ALLERGIES Allergies  Allergen Reactions  . Other Photosensitivity    Pt. Is sensitive to steroids.     PAST MEDICAL HISTORY Past Medical History:  Diagnosis Date  . Allergy   . Anxiety   . Cancer ([place])    skin cancer  . Depression   . Hypothyroidism   . Thyroid disease    Past Surgical History:  Procedure Laterality Date  . COLON SURGERY    . HERNIA REPAIR    . INCISION AND DRAINAGE OF WOUND Right 07/10/2016   Procedure: DEBRIDEMENT DISTAL  INTERPHALANGEAL JOINT;  Surgeon: Daryll Brod, MD;  Location: [facility];  Service: Orthopedics;  Laterality: Right;  small finger  . MASS EXCISION Right 07/10/2016   Procedure: EXCISION CYST RIGHT SMALL FINGER;  Surgeon: Daryll Brod, MD;  Location: [facility];  Service: Orthopedics;  Laterality: Right;  small finger    FAMILY HISTORY Family History  Problem Relation Age of Onset  . Cancer Mother   . Diabetes Mother   . Hyperlipidemia Mother   . Hypertension Mother   . Mental illness Mother   . Diabetes Father   . Hyperlipidemia Father   . Hypertension Father   . Diabetes Sister   . Hyperlipidemia Sister   . Mental illness Sister   . Diabetes Brother   . Hyperlipidemia Brother   . Hypertension Brother   . Mental illness Brother     SOCIAL HISTORY Social History   Tobacco Use  . Smoking status: Former Smoker    Types: Cigarettes    Quit date: 07/10/1976    Years since quitting: 43.4  . Smokeless tobacco: Never Used  Vaping Use  . Vaping Use: Never used  Substance Use Topics  . Alcohol use: Yes    Alcohol/week: 0.0 standard drinks    Comment: 12-15 drinks   . Drug use: No         OPHTHALMIC  EXAM:  Base Eye Exam    Visual Acuity (Snellen - Linear)      Right Left   Dist cc 20/25 20/20       Tonometry (Tonopen, 8:17 AM)      Right Left   Pressure 10 12       Pupils      Pupils Dark Light Shape React APD   Right PERRL 4 3 Round Brisk None   Left PERRL 4 3 Round Brisk None       Visual Fields (Counting fingers)      Left Right    Full Full       Neuro/Psych    Oriented x3: Yes   Mood/Affect: Normal       Dilation    Both eyes: 1.0% Mydriacyl, 2.5% Phenylephrine @ 8:17 AM        Slit Lamp and Fundus Exam    External Exam      Right Left   External Normal Normal       Slit Lamp Exam      Right Left   Lids/Lashes Normal Normal   Conjunctiva/Sclera White and quiet White and quiet   Cornea Clear Clear   Anterior  Chamber Deep and quiet Deep and quiet   Iris Round and reactive Round and reactive   Lens 1+ Nuclear sclerosis 1+ Nuclear sclerosis   Anterior Vitreous Normal Normal       Fundus Exam      Right Left   Posterior Vitreous Posterior vitreous detachment, Central vitreous floaters Posterior vitreous detachment, Central vitreous floaters   Disc Normal Normal   C/D Ratio 0.15 0.15   Macula Epiretinal membrane, with moderate topographic distortion clinically Epiretinal membrane, with moderate topographic distortion clinically   Vessels Normal Normal   Periphery Pigmentary changes and lattice inferiorly and inferotemporal, no retinal breaks by scleral depression Pigmentary changes and lattice inferotemporal, no retinal breaks by scleral depression, 25d OU          IMAGING AND PROCEDURES  Imaging and Procedures for 12/01/19  OCT, Retina - OU - Both Eyes       Right Eye Quality was good. Scan locations included subfoveal. Central Foveal Thickness: 438. Findings include epiretinal membrane.   Left Eye Quality was good. Scan locations included subfoveal. Central Foveal Thickness: 298. Findings include epiretinal membrane.   Notes OD, with progression of epiretinal membrane and macular thickening, intraretinal pseudocystoid macular edema  Is present as inner macular retina schisis  OS with similar epiretinal membrane most prominent nasal to the fovea with cystoid change on the temporal aspect of the fovea.         Color Fundus Photography Optos - OU - Both Eyes       Right Eye Progression has been stable. Macula : epiretinal membrane. Vessels : normal observations. Periphery : normal observations.   Left Eye Progression has been stable. Disc findings include normal observations. Macula : epiretinal membrane. Periphery : normal observations.                 ASSESSMENT/PLAN:  Left epiretinal membrane This condition left eye is moderate and mostly nasal to the fovea.  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  In this patient who has not had cataract surgery I will recommend however consideration of cataract surgical performance So asked to determine whether that visual acuity improvement will prevent the need for surgical removal of the epiretinal membrane.  Splane to the patient that removing the epiretinal membrane first triggers rapid cataract progression in her age group.  As I often last for the "easier" cataract surgery be performed so as to which issue is triggering the most visual acuity impact in her visual system    Nuclear sclerotic cataract of both eyes The nature of cataract was discussed with the patient as well as the elective nature of surgery. The patient was reassured that surgery at a later date does not put the patient at risk for a worse outcome. It was emphasized that the need for surgery is dictated by the patient's quality of life as influenced by the cataract. Patient was instructed to maintain close follow up with their general eye care doctor.  Current cataract in each eye with some refractive impact, a moderate amount of cloudiness and color change.  Nonetheless this may have some visual impact making her current symptoms from epiretinal membrane worse.  If it is for this reason I do typically recommend cataract surgery with intraocular lens implant, initially in the right eye to allow for best clarity for surgical removal of epiretinal membrane only if it is necessary.  Visual acuity does not improve after  cataract surgery to a level satisfactory for her visual needs at that time surgical intervention the epiretinal membrane could be undertaken      ICD-10-CM   1. Right epiretinal membrane  H35.371   2. Left epiretinal membrane  H35.372   3. 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 Cystoid macular edema of left eye  H35.352 OCT, Retina - OU - Both Eyes  6. Round hole of left eye  H33.322 Color Fundus Photography Optos - OU - Both Eyes  7. Round hole of right eye  H33.321 Color Fundus Photography Optos - OU - Both Eyes    1.  I long discussion with the patient regarding the findings today.  I explained the nature of performance of cataract surgery initially in the right eye to determine if visual acuity improves enough so that no further surgical intervention is required for other disorders. She  Indicated her understanding of these considerations and moreover the beneficial effect of having a cataract out of the way he should vitrectomy and ILM peel be needed later, in this right eye.  2.  I will suggest cataract surgery with intraocular lens implantation of the right eye initially with standard intraocular lens.  Visual acuity improved substantially, and patient is no longer symptomatic with reading difficulties (with or without glasses) I would continue to observe there after  3.  The patient Understands that cataract surgery left eye may be required thereafter to balance the refraction of each eye   Ophthalmic Meds Ordered this visit:  No orders of the defined types were placed in this encounter.      Return in about 5 weeks (around 01/05/2020) for dilate, OD, OCT.  There are no Patient Instructions on file for this visit.   Explained the diagnoses, plan, and follow up with the patient and they expressed  understanding.  Patient expressed understanding of the importance of proper follow up care.   Clent Demark Ronell Duffus  M.D. Diseases & Surgery of the Retina and Vitreous [facility]ina [facility] [place] 12/01/19     Abbreviations: M myopia (nearsighted); A astigmatism; H hyperopia (farsighted); P presbyopia; Mrx spectacle prescription;  CTL contact lenses; OD right eye; OS left eye; OU both eyes  XT exotropia; ET esotropia; PEK punctate epithelial keratitis; PEE punctate epithelial erosions; DES dry eye syndrome; MGD meibomian gland dysfunction; ATs artificial tears; PFAT's preservative free artificial tears; [place] nuclear sclerotic cataract; PSC posterior subcapsular cataract; ERM epi-retinal membrane; PVD posterior vitreous detachment; RD retinal detachment; DM diabetes mellitus; DR diabetic retinopathy; NPDR non-proliferative diabetic retinopathy; PDR proliferative diabetic retinopathy; CSME clinically significant macular edema; DME diabetic macular edema; dbh dot blot hemorrhages; CWS cotton wool spot; POAG primary open angle glaucoma; C/D cup-to-disc ratio; HVF humphrey visual field; GVF goldmann visual field; OCT optical coherence tomography; IOP intraocular pressure; BRVO Branch retinal vein occlusion; CRVO central retinal vein occlusion; CRAO central retinal artery occlusion; BRAO branch retinal artery occlusion; RT retinal tear; SB scleral buckle; PPV pars plana vitrectomy; VH Vitreous hemorrhage; PRP panretinal laser photocoagulation; IVK intravitreal kenalog; VMT vitreomacular traction; MH Macular hole;  NVD neovascularization of the disc; NVE neovascularization elsewhere; AREDS age related eye disease study; ARMD age related macular degeneration; POAG primary open angle glaucoma; EBMD epithelial/anterior basement membrane dystrophy; ACIOL anterior chamber intraocular lens; IOL intraocular lens; PCIOL posterior chamber intraocular lens; Phaco/IOL phacoemulsification with intraocular lens placement; [place] photorefractive keratectomy; LASIK laser assisted in situ keratomileusis; HTN hypertension; DM diabetes mellitus; COPD  chronic obstructive pulmonary disease

## 2019-12-01 NOTE — Assessment & Plan Note (Signed)
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  Will observe

## 2019-12-01 NOTE — Assessment & Plan Note (Signed)
This condition left eye is moderate and mostly nasal to the fovea.  No foveal changes per se and I would recommend observe the left eye from the standpoint of epiretinal membrane

## 2020-01-05 ENCOUNTER — Encounter (INDEPENDENT_AMBULATORY_CARE_PROVIDER_SITE_OTHER): Payer: BC Managed Care – PPO | Admitting: Ophthalmology

## 2020-01-16 ENCOUNTER — Ambulatory Visit: Payer: BC Managed Care – PPO | Attending: Internal Medicine

## 2020-01-16 DIAGNOSIS — Z23 Encounter for immunization: Secondary | ICD-10-CM

## 2020-01-16 NOTE — Progress Notes (Signed)
° °  Covid-19 Vaccination Clinic  Name:  MACKENNA KAMER    MRN: [id number] DOB: 1951-02-17  01/16/2020  Ms. Duplessis was observed post Covid-19 immunization for 15 minutes without incident. She was provided with Vaccine Information Sheet and instruction to access the V-Safe system.   Ms. Redditt was instructed to call 911 with any severe reactions post vaccine:  Difficulty breathing   Swelling of face and throat   A fast heartbeat   A bad rash all over body   Dizziness and weakness

## 2020-01-21 ENCOUNTER — Other Ambulatory Visit: Payer: Self-pay

## 2020-01-21 ENCOUNTER — Ambulatory Visit (INDEPENDENT_AMBULATORY_CARE_PROVIDER_SITE_OTHER): Payer: BC Managed Care – PPO | Admitting: Ophthalmology

## 2020-01-21 ENCOUNTER — Encounter (INDEPENDENT_AMBULATORY_CARE_PROVIDER_SITE_OTHER): Payer: Self-pay | Admitting: Ophthalmology

## 2020-01-21 DIAGNOSIS — H35413 Lattice degeneration of retina, bilateral: Secondary | ICD-10-CM

## 2020-01-21 DIAGNOSIS — H35371 Puckering of macula, right eye: Secondary | ICD-10-CM | POA: Diagnosis not present

## 2020-01-21 DIAGNOSIS — H2513 Age-related nuclear cataract, bilateral: Secondary | ICD-10-CM | POA: Diagnosis not present

## 2020-01-21 DIAGNOSIS — H33311 Horseshoe tear of retina without detachment, right eye: Secondary | ICD-10-CM | POA: Diagnosis not present

## 2020-01-21 NOTE — Assessment & Plan Note (Signed)
As per Dr. Delton Prairie, plan cataract extraction with intraocular displacement in the future

## 2020-01-21 NOTE — Assessment & Plan Note (Signed)
New in the patch of lattice inferiorly, will need laser retinopexy to surround this region in anticipation of planned cataract extraction intraocular lens placement right eye

## 2020-01-21 NOTE — Assessment & Plan Note (Signed)
OD, with retinal hole, OS with area of traction at 3 o'clock position should consider laser retinopexy in the left eye in the future

## 2020-01-21 NOTE — Patient Instructions (Signed)
Instructed to notify the office promptly for new onset sparkling lights flashes or floaters or curtain of darkness that is new and dramatic

## 2020-01-21 NOTE — Progress Notes (Signed)
01/21/2020     CHIEF COMPLAINT Patient presents for Retina Follow Up   HISTORY OF PRESENT ILLNESS: Brandy Brooks is a 69 y.o. female who presents to the clinic today for:   HPI    Retina Follow Up    Patient presents with  Other.  In right eye.  This started 7 weeks ago.  Duration of 7 weeks.  Since onset it is gradually worsening.          Comments    7 WK F/U, FLAP TEAR OD, CAT CLEARANCE PER S.GROAT   Pt reports vision has declined OD, no F/F          Last edited by Nichola Sizer D on 01/21/2020  9:00 AM. (History)      Referring physician: No referring provider defined for this encounter.  HISTORICAL INFORMATION:   Selected notes from the MEDICAL RECORD NUMBER       CURRENT MEDICATIONS: No current outpatient medications on file. (Ophthalmic Drugs)   No current facility-administered medications for this visit. (Ophthalmic Drugs)   Current Outpatient Medications (Other)  Medication Sig  . HYDROcodone-acetaminophen (NORCO) 5-325 MG tablet Take 1 tablet by mouth every 6 (six) hours as needed for moderate pain.  Marland Kitchen levothyroxine (SYNTHROID, LEVOTHROID) 100 MCG tablet TAKE 1 TABLET (100 MCG TOTAL) BY MOUTH DAILY BEFORE BREAKFAST.  "OV NEEDED FOR ADDITIONAL REFILLS"   No current facility-administered medications for this visit. (Other)      REVIEW OF SYSTEMS:    ALLERGIES Allergies  Allergen Reactions  . Other Photosensitivity    Pt. Is sensitive to steroids.     PAST MEDICAL HISTORY Past Medical History:  Diagnosis Date  . Allergy   . Anxiety   . Cancer ([place])    skin cancer  . Depression   . Hypothyroidism   . Thyroid disease    Past Surgical History:  Procedure Laterality Date  . COLON SURGERY    . HERNIA REPAIR    . INCISION AND DRAINAGE OF WOUND Right 07/10/2016   Procedure: DEBRIDEMENT DISTAL INTERPHALANGEAL JOINT;  Surgeon: Daryll Brod, MD;  Location: [place];  Service: Orthopedics;  Laterality: Right;   small finger  . MASS EXCISION Right 07/10/2016   Procedure: EXCISION CYST RIGHT SMALL FINGER;  Surgeon: Daryll Brod, MD;  Location: [place];  Service: Orthopedics;  Laterality: Right;  small finger    FAMILY HISTORY Family History  Problem Relation Age of Onset  . Cancer Mother   . Diabetes Mother   . Hyperlipidemia Mother   . Hypertension Mother   . Mental illness Mother   . Diabetes Father   . Hyperlipidemia Father   . Hypertension Father   . Diabetes Sister   . Hyperlipidemia Sister   . Mental illness Sister   . Diabetes Brother   . Hyperlipidemia Brother   . Hypertension Brother   . Mental illness Brother     SOCIAL HISTORY Social History   Tobacco Use  . Smoking status: Former Smoker    Types: Cigarettes    Quit date: 07/10/1976    Years since quitting: 43.5  . Smokeless tobacco: Never Used  Vaping Use  . Vaping Use: Never used  Substance Use Topics  . Alcohol use: Yes    Alcohol/week: 0.0 standard drinks    Comment: 12-15 drinks   . Drug use: No         OPHTHALMIC EXAM:  Base Eye Exam    Visual Acuity (ETDRS)  Right Left   Dist cc 20/30 -1 20/20   Dist ph cc NI    Correction: Glasses       Tonometry (Tonopen, 9:06 AM)      Right Left   Pressure 27 26  2x OU, pt squinting, anxious        Pupils      Dark Light Shape React APD   Right 4 3 Round Brisk None   Left 4 3 Round Brisk None       Visual Fields (Counting fingers)      Left Right    Full Full       Extraocular Movement      Right Left    Full Full       Neuro/Psych    Oriented x3: Yes   Mood/Affect: Normal       Dilation    Both eyes: 1.0% Mydriacyl, 2.5% Phenylephrine @ 9:06 AM        Slit Lamp and Fundus Exam    External Exam      Right Left   External Normal Normal       Slit Lamp Exam      Right Left   Lids/Lashes Normal Normal   Conjunctiva/Sclera White and quiet White and quiet   Cornea Clear Clear   Anterior Chamber Deep and quiet  Deep and quiet   Iris Round and reactive Round and reactive   Lens 1+ Nuclear sclerosis 1+ Nuclear sclerosis   Anterior Vitreous Normal Normal       Fundus Exam      Right Left   Posterior Vitreous Posterior vitreous detachment, Central vitreous floaters Posterior vitreous detachment, Central vitreous floaters   Disc Normal Normal   C/D Ratio 0.15 0.15   Macula Epiretinal membrane, with moderate topographic distortion clinically Epiretinal membrane, with moderate topographic distortion clinically   Vessels Normal Normal   Periphery Pigmentary changes and lattice inferiorly and inferotemporal, no retinal breaks by scleral depression, Lattice degeneration 5-7 with flap tear @ 530,  Pigmentary changes and lattice inferotemporal, no retinal breaks by scleral depression, 25d OU, Lattice degeneration, 5-7,  Traction @ 3          IMAGING AND PROCEDURES  Imaging and Procedures for 01/21/20  Color Fundus Photography Optos - OU - Both Eyes       Right Eye Progression has been stable. Macula : normal observations. Vessels : normal observations. Periphery : lattice, degeneration.   Left Eye Progression has been stable. Disc findings include normal observations. Macula : normal observations. Vessels : normal observations. Periphery : lattice, degeneration.   Notes Lattice degeneration noted inferiorly, no details of the tear inferiorly seen clinically can be identified OD on colors  OS, lattice degeneration temporally       Repair Retinal Breaks, Laser - OD - Right Eye       Tear locations include inferior.   Time Out Confirmed correct patient, procedure, site, and patient consented.   Anesthesia Topical anesthesia was used. Anesthetic medications included Proparacaine 0.5%.   Laser Information The type of laser was diode. Color was yellow. The duration in seconds was 0.02. The spot size was 390 microns. Laser power was 280. Total spots was 302.   Post-op The patient  tolerated the procedure well. There were no complications. The patient received written and verbal post procedure care education.   Notes Horseshoe retinal tear centered at 530 meridian inferiorly treated with 3 rows of laser retinopexy without complication  ASSESSMENT/PLAN:  Horseshoe tear of retina, right New in the patch of lattice inferiorly, will need laser retinopexy to surround this region in anticipation of planned cataract extraction intraocular lens placement right eye  Lattice degeneration of both retinas OD, with retinal hole, OS with area of traction at 3 o'clock position should consider laser retinopexy in the left eye in the future  Nuclear sclerotic cataract of both eyes As per Dr. Delton [place], plan cataract extraction with intraocular displacement in the future      ICD-10-CM   1. Horseshoe tear of retina, right  H33.311 Repair Retinal Breaks, Laser - OD - Right Eye  2. Right epiretinal membrane  H35.371 Color Fundus Photography Optos - OU - Both Eyes  3. Lattice degeneration of both retinas  H35.413   4. Nuclear sclerotic cataract of both eyes  H25.13     1.  Laser retinopexy offered and delivered OD today for retinal tear  2.  Laser retinopexy left eye will be delivered to the 3:00 meridian of lattice with tractional change in elevation of the retina coming weeks  3.  Ophthalmic Meds Ordered this visit:  No orders of the defined types were placed in this encounter.      Return in about 2 weeks (around 02/04/2020) for dilate, OS, RETINOPEXY.  There are no Patient Instructions on file for this visit.   Explained the diagnoses, plan, and follow up with the patient and they expressed understanding.  Patient expressed understanding of the importance of proper follow up care.   Clent Demark Amara Manalang M.D. Diseases & Surgery of the Retina and Vitreous [facility] [place] 01/21/20     Abbreviations: M myopia (nearsighted); A  astigmatism; H hyperopia (farsighted); P presbyopia; Mrx spectacle prescription;  CTL contact lenses; OD right eye; OS left eye; OU both eyes  XT exotropia; ET esotropia; PEK punctate epithelial keratitis; PEE punctate epithelial erosions; DES dry eye syndrome; MGD meibomian gland dysfunction; ATs artificial tears; PFAT's preservative free artificial tears; [place] nuclear sclerotic cataract; PSC posterior subcapsular cataract; ERM epi-retinal membrane; PVD posterior vitreous detachment; RD retinal detachment; DM diabetes mellitus; DR diabetic retinopathy; NPDR non-proliferative diabetic retinopathy; PDR proliferative diabetic retinopathy; CSME clinically significant macular edema; DME diabetic macular edema; dbh dot blot hemorrhages; CWS cotton wool spot; POAG primary open angle glaucoma; C/D cup-to-disc ratio; HVF humphrey visual field; GVF goldmann visual field; OCT optical coherence tomography; IOP intraocular pressure; BRVO Branch retinal vein occlusion; CRVO central retinal vein occlusion; CRAO central retinal artery occlusion; BRAO branch retinal artery occlusion; RT retinal tear; SB scleral buckle; PPV pars plana vitrectomy; VH Vitreous hemorrhage; PRP panretinal laser photocoagulation; IVK intravitreal kenalog; VMT vitreomacular traction; MH Macular hole;  NVD neovascularization of the disc; NVE neovascularization elsewhere; AREDS age related eye disease study; ARMD age related macular degeneration; POAG primary open angle glaucoma; EBMD epithelial/anterior basement membrane dystrophy; ACIOL anterior chamber intraocular lens; IOL intraocular lens; PCIOL posterior chamber intraocular lens; Phaco/IOL phacoemulsification with intraocular lens placement; [place] photorefractive keratectomy; LASIK laser assisted in situ keratomileusis; HTN hypertension; DM diabetes mellitus; COPD chronic obstructive pulmonary disease

## 2020-02-04 ENCOUNTER — Ambulatory Visit (INDEPENDENT_AMBULATORY_CARE_PROVIDER_SITE_OTHER): Payer: BC Managed Care – PPO | Admitting: Ophthalmology

## 2020-02-04 ENCOUNTER — Encounter (INDEPENDENT_AMBULATORY_CARE_PROVIDER_SITE_OTHER): Payer: Self-pay | Admitting: Ophthalmology

## 2020-02-04 ENCOUNTER — Other Ambulatory Visit: Payer: Self-pay

## 2020-02-04 DIAGNOSIS — H35412 Lattice degeneration of retina, left eye: Secondary | ICD-10-CM | POA: Diagnosis not present

## 2020-02-04 NOTE — Progress Notes (Signed)
02/04/2020     CHIEF COMPLAINT Patient presents for Retina Follow Up   HISTORY OF PRESENT ILLNESS: Brandy Brooks is a 69 y.o. female who presents to the clinic today for:   HPI    Retina Follow Up    Patient presents with  Other.  In left eye.  This started 2 weeks ago.  Severity is mild.  Duration of 2 weeks.  Since onset it is stable.          Comments    2 WK Retinopexy OS   Pt report stable vision, watering OU.        Last edited by Nichola Sizer D on 02/04/2020  8:48 AM. (History)      Referring physician: No referring provider defined for this encounter.  HISTORICAL INFORMATION:   Selected notes from the MEDICAL RECORD NUMBER       CURRENT MEDICATIONS: No current outpatient medications on file. (Ophthalmic Drugs)   No current facility-administered medications for this visit. (Ophthalmic Drugs)   Current Outpatient Medications (Other)  Medication Sig  . HYDROcodone-acetaminophen (NORCO) 5-325 MG tablet Take 1 tablet by mouth every 6 (six) hours as needed for moderate pain.  Marland Kitchen levothyroxine (SYNTHROID, LEVOTHROID) 100 MCG tablet TAKE 1 TABLET (100 MCG TOTAL) BY MOUTH DAILY BEFORE BREAKFAST.  "OV NEEDED FOR ADDITIONAL REFILLS"   No current facility-administered medications for this visit. (Other)      REVIEW OF SYSTEMS:    ALLERGIES Allergies  Allergen Reactions  . Other Photosensitivity    Pt. Is sensitive to steroids.     PAST MEDICAL HISTORY Past Medical History:  Diagnosis Date  . Allergy   . Anxiety   . Cancer ([place])    skin cancer  . Depression   . Hypothyroidism   . Thyroid disease    Past Surgical History:  Procedure Laterality Date  . COLON SURGERY    . HERNIA REPAIR    . INCISION AND DRAINAGE OF WOUND Right 07/10/2016   Procedure: DEBRIDEMENT DISTAL INTERPHALANGEAL JOINT;  Surgeon: Daryll Brod, MD;  Location: [place];  Service: Orthopedics;  Laterality: Right;  small finger  . MASS EXCISION Right  07/10/2016   Procedure: EXCISION CYST RIGHT SMALL FINGER;  Surgeon: Daryll Brod, MD;  Location: [facility];  Service: Orthopedics;  Laterality: Right;  small finger    FAMILY HISTORY Family History  Problem Relation Age of Onset  . Cancer Mother   . Diabetes Mother   . Hyperlipidemia Mother   . Hypertension Mother   . Mental illness Mother   . Diabetes Father   . Hyperlipidemia Father   . Hypertension Father   . Diabetes Sister   . Hyperlipidemia Sister   . Mental illness Sister   . Diabetes Brother   . Hyperlipidemia Brother   . Hypertension Brother   . Mental illness Brother     SOCIAL HISTORY Social History   Tobacco Use  . Smoking status: Former Smoker    Types: Cigarettes    Quit date: 07/10/1976    Years since quitting: 43.6  . Smokeless tobacco: Never Used  Vaping Use  . Vaping Use: Never used  Substance Use Topics  . Alcohol use: Yes    Alcohol/week: 0.0 standard drinks    Comment: 12-15 drinks   . Drug use: No         OPHTHALMIC EXAM: Base Eye Exam    Visual Acuity (ETDRS)      Right Left  Dist cc 20/30 +1 20/25 +2   Dist ph cc NI    Correction: Glasses       Tonometry (Tonopen, 8:54 AM)      Right Left   Pressure 19 21       Pupils      Pupils Dark Light Shape React APD   Right PERRL 4 3 Round Brisk None   Left PERRL 4 3 Round Brisk None       Visual Fields (Counting fingers)      Left Right    Full Full       Extraocular Movement      Right Left    Full Full       Neuro/Psych    Oriented x3: Yes   Mood/Affect: Normal       Dilation    Left eye: 1.0% Mydriacyl, 2.5% Phenylephrine @ 8:55 AM        Slit Lamp and Fundus Exam    External Exam      Right Left   External  Normal       Slit Lamp Exam      Right Left   Lids/Lashes  Normal   Conjunctiva/Sclera  White and quiet   Cornea  Clear   Anterior Chamber  Deep and quiet   Iris  Round and reactive   Lens  1+ Nuclear sclerosis   Anterior Vitreous   Normal       Fundus Exam      Right Left   Posterior Vitreous  Posterior vitreous detachment, Central vitreous floaters   Disc  Normal   C/D Ratio  0.15   Macula  Epiretinal membrane, with moderate topographic distortion clinically   Vessels  Normal   Periphery  Pigmentary changes and lattice inferotemporal, no retinal breaks by scleral depression, 25d OU, Lattice degeneration, 5-7,  Traction @ 3          IMAGING AND PROCEDURES  Imaging and Procedures for 02/04/20  Repair Retinal Breaks, Laser - OS - Left Eye       Tear locations include inferior, temporal.   Time Out Confirmed correct patient, procedure, site, and patient consented.   Anesthesia Topical anesthesia was used. Anesthetic medications included Proparacaine 0.5%.   Laser Information The type of laser was diode. Color was yellow. The duration in seconds was 0.02. The spot size was 390 microns. Laser power was 260. Total spots was 392.   Post-op The patient tolerated the procedure well. There were no complications. The patient received written and verbal post procedure care education.                 ASSESSMENT/PLAN:  Lattice degeneration of retina, left Lattice degeneration inferotemporally with atrophic holes from 430 to 6 o'clock position treated today, no complications      [id number]   1. Lattice degeneration of retina, left  H35.412 Repair Retinal Breaks, Laser - OS - Left Eye    1.  Laser retinopexy OS completed today.  2.  Patient has planned cataract extraction with intraocular lens placement right eye, in January 2022, we will assess visual acuity thereafter and if acuity improves, no further intervention would be necessary.  We will assess the effect of the epiretinal membrane on the right eye thereafter  3.  Ophthalmic Meds Ordered this visit:  No orders of the defined types were placed in this encounter.      Return in about 4 months (around 06/04/2020) for COLOR FP, DILATE  OU.  There are no Patient Instructions on file for this visit.   Explained the diagnoses, plan, and follow up with the patient and they expressed understanding.  Patient expressed understanding of the importance of proper follow up care.   Clent Demark Paeton Studer M.D. Diseases & Surgery of the Retina and Vitreous Retina [facility] Diabetic [place] [address] 02/04/20     Abbreviations: M myopia (nearsighted); A astigmatism; H hyperopia (farsighted); P presbyopia; Mrx spectacle prescription;  CTL contact lenses; OD right eye; OS left eye; OU both eyes  XT exotropia; ET esotropia; PEK punctate epithelial keratitis; PEE punctate epithelial erosions; DES dry eye syndrome; MGD meibomian gland dysfunction; ATs artificial tears; PFAT's preservative free artificial tears; [place] nuclear sclerotic cataract; PSC posterior subcapsular cataract; ERM epi-retinal membrane; PVD posterior vitreous detachment; RD retinal detachment; DM diabetes mellitus; DR diabetic retinopathy; NPDR non-proliferative diabetic retinopathy; PDR proliferative diabetic retinopathy; CSME clinically significant macular edema; DME diabetic macular edema; dbh dot blot hemorrhages; CWS cotton wool spot; POAG primary open angle glaucoma; C/D cup-to-disc ratio; HVF humphrey visual field; GVF goldmann visual field; OCT optical coherence tomography; IOP intraocular pressure; BRVO Branch retinal vein occlusion; CRVO central retinal vein occlusion; CRAO central retinal artery occlusion; BRAO branch retinal artery occlusion; RT retinal tear; SB scleral buckle; PPV pars plana vitrectomy; VH Vitreous hemorrhage; PRP panretinal laser photocoagulation; IVK intravitreal kenalog; VMT vitreomacular traction; MH Macular hole;  NVD neovascularization of the disc; NVE neovascularization elsewhere; AREDS age related eye disease study; ARMD age related macular degeneration; POAG primary open angle glaucoma; EBMD epithelial/anterior basement membrane dystrophy; ACIOL anterior chamber  intraocular lens; IOL intraocular lens; PCIOL posterior chamber intraocular lens; Phaco/IOL phacoemulsification with intraocular lens placement; [place] photorefractive keratectomy; LASIK laser assisted in situ keratomileusis; HTN hypertension; DM diabetes mellitus; COPD chronic obstructive pulmonary disease

## 2020-02-04 NOTE — Assessment & Plan Note (Signed)
Lattice degeneration inferotemporally with atrophic holes from 430 to 6 o'clock position treated today, no complications

## 2020-04-07 ENCOUNTER — Encounter (INDEPENDENT_AMBULATORY_CARE_PROVIDER_SITE_OTHER): Payer: Self-pay

## 2020-06-06 ENCOUNTER — Other Ambulatory Visit: Payer: Self-pay

## 2020-06-06 ENCOUNTER — Encounter (INDEPENDENT_AMBULATORY_CARE_PROVIDER_SITE_OTHER): Payer: Self-pay | Admitting: Ophthalmology

## 2020-06-06 ENCOUNTER — Ambulatory Visit (INDEPENDENT_AMBULATORY_CARE_PROVIDER_SITE_OTHER): Payer: BC Managed Care – PPO | Admitting: Ophthalmology

## 2020-06-06 DIAGNOSIS — H35371 Puckering of macula, right eye: Secondary | ICD-10-CM | POA: Diagnosis not present

## 2020-06-06 DIAGNOSIS — H35412 Lattice degeneration of retina, left eye: Secondary | ICD-10-CM | POA: Diagnosis not present

## 2020-06-06 DIAGNOSIS — H35372 Puckering of macula, left eye: Secondary | ICD-10-CM

## 2020-06-06 DIAGNOSIS — H35352 Cystoid macular degeneration, left eye: Secondary | ICD-10-CM | POA: Diagnosis not present

## 2020-06-06 DIAGNOSIS — H33322 Round hole, left eye: Secondary | ICD-10-CM

## 2020-06-06 DIAGNOSIS — H59011 Keratopathy (bullous aphakic) following cataract surgery, right eye: Secondary | ICD-10-CM

## 2020-06-06 DIAGNOSIS — H33321 Round hole, right eye: Secondary | ICD-10-CM

## 2020-06-06 DIAGNOSIS — H33103 Unspecified retinoschisis, bilateral: Secondary | ICD-10-CM | POA: Insufficient documentation

## 2020-06-06 NOTE — Assessment & Plan Note (Signed)
This is not an active condition in the left eye. 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

## 2020-06-06 NOTE — Assessment & Plan Note (Addendum)
The nature of macular pucker (epiretinal membrane ERM) was discussed with the patient as well as threshold criteria for vitrectomy surgery. I explained that in rare cases another surgery is needed to actually remove a second wrinkle should it regrow.  Most often, the epiretinal membrane and underlying wrinkled internal limiting membrane are removed with the first surgery, to accomplish the goals.   If the operative eye is [place] (natural lens still present), cataract surgery is often recommended prior to Vitrectomy. This will enable the retina surgeon to have the best view during surgery and the patient to obtain optimal results in the future. Treatment options were discussed.  OD, with persistent epiretinal membrane with secondary foveal macular schisis.  Intact outer foveal retina with maintenance of 20/25 vision postop cataract surgery.  Minor increase in retinal thickness yet with no change appreciably in the visual acuity.  Moreover no increase in foveal macular schisis in the parafoveal region right  Patient will have the option 1 day of vitrectomy membrane peel should the foveal distortion impact her visual functioning at all.

## 2020-06-06 NOTE — Assessment & Plan Note (Signed)
Minor epiretinal membrane nasal to the fovea, irregular foveal region

## 2020-06-06 NOTE — Assessment & Plan Note (Addendum)
Laser retinopexy 01/28/2018, and 11/21

## 2020-06-06 NOTE — Progress Notes (Signed)
06/06/2020     CHIEF COMPLAINT Patient presents for Retina Follow Up (4 Month f\u OU. FP/Pt states she had cat sx on OD with Dr. Wyatt Portela. Pt states she does not notice any change in vision since the sx.  )   HISTORY OF PRESENT ILLNESS: Brandy Brooks is a 70 y.o. 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  Severity is moderate.  Duration of 4 months.  Since onset it is stable.  I, the attending physician,  performed the HPI with the patient and updated documentation appropriately. Additional comments: 4 Month f\u OU. FP Pt states she had cat sx on OD with Dr. Wyatt Portela. Pt states she does not notice any change in vision since the sx.         Last edited by Tilda Franco on 06/06/2020  8:13 AM. (History)      Referring physician: Lucianne Lei, MD [address] [place],  [place] [address]  HISTORICAL INFORMATION:   Selected notes from the MEDICAL RECORD NUMBER       CURRENT MEDICATIONS: No current outpatient medications on file. (Ophthalmic Drugs)   No current facility-administered medications for this visit. (Ophthalmic Drugs)   Current Outpatient Medications (Other)  Medication Sig  . HYDROcodone-acetaminophen (NORCO) 5-325 MG tablet Take 1 tablet by mouth every 6 (six) hours as needed for moderate pain.  Marland Kitchen levothyroxine (SYNTHROID, LEVOTHROID) 100 MCG tablet TAKE 1 TABLET (100 MCG TOTAL) BY MOUTH DAILY BEFORE BREAKFAST.  "OV NEEDED FOR ADDITIONAL REFILLS"   No current facility-administered medications for this visit. (Other)      REVIEW OF SYSTEMS:    ALLERGIES Allergies  Allergen Reactions  . Other Photosensitivity    Pt. Is sensitive to steroids.     PAST MEDICAL HISTORY Past Medical History:  Diagnosis Date  . Allergy   . Anxiety   . Cancer ([place])    skin cancer  . Depression   . Hypothyroidism   . Thyroid disease    Past Surgical History:   Procedure Laterality Date  . COLON SURGERY    . HERNIA REPAIR    . INCISION AND DRAINAGE OF WOUND Right 07/10/2016   Procedure: DEBRIDEMENT DISTAL INTERPHALANGEAL JOINT;  Surgeon: Daryll Brod, MD;  Location: [facility];  Service: Orthopedics;  Laterality: Right;  small finger  . MASS EXCISION Right 07/10/2016   Procedure: EXCISION CYST RIGHT SMALL FINGER;  Surgeon: Daryll Brod, MD;  Location: [facility];  Service: Orthopedics;  Laterality: Right;  small finger    FAMILY HISTORY Family History  Problem Relation Age of Onset  . Cancer Mother   . Diabetes Mother   . Hyperlipidemia Mother   . Hypertension Mother   . Mental illness Mother   . Diabetes Father   . Hyperlipidemia Father   . Hypertension Father   . Diabetes Sister   . Hyperlipidemia Sister   . Mental illness Sister   . Diabetes Brother   . Hyperlipidemia Brother   . Hypertension Brother   . Mental illness Brother     SOCIAL HISTORY Social History   Tobacco Use  . Smoking status: Former Smoker    Types: Cigarettes    Quit date: 07/10/1976    Years since quitting: 43.9  . Smokeless tobacco: Never Used  Vaping Use  . Vaping Use: Never used  Substance Use Topics  . Alcohol use: Yes    Alcohol/week: 0.0 standard drinks    Comment: 12-15 drinks   . Drug use: No         OPHTHALMIC EXAM:  Base Eye Exam    Visual Acuity (Snellen - Linear)      Right Left   Dist cc 20/25 -1 20/25 -1   Correction: Glasses       Tonometry (Tonopen, 8:17 AM)      Right Left   Pressure 10 10       Pupils      Pupils Dark Light Shape React APD   Right PERRL 4 3 Round Slow None   Left PERRL 4 3 Round Slow None       Visual Fields (Counting fingers)      Left Right    Full Full       Neuro/Psych    Oriented x3: Yes   Mood/Affect: Normal       Dilation    Both eyes: 1.0% Mydriacyl, 2.5% Phenylephrine @ 8:17 AM        Slit Lamp and Fundus Exam    External Exam      Right Left    External Normal Normal       Slit Lamp Exam      Right Left   Lids/Lashes Normal Normal   Conjunctiva/Sclera White and quiet White and quiet   Cornea Clear Clear   Anterior Chamber Deep and quiet Deep and quiet   Iris Round and reactive Round and reactive   Lens Centered posterior chamber intraocular lens 1+ Nuclear sclerosis   Anterior Vitreous Normal Normal       Fundus Exam      Right Left   Posterior Vitreous Posterior vitreous detachment, Central vitreous floaters Posterior vitreous detachment, Central vitreous floaters   Disc Normal Normal   C/D Ratio 0.15 0.15   Macula Epiretinal membrane, with moderate topographic distortion clinically Epiretinal membrane, with moderate topographic distortion clinically   Vessels Normal Normal   Periphery Pigmentary changes and lattice inferiorly and inferotemporal, no retinal breaks by scleral depression, Lattice degeneration 5-7 with flap tear @ 530,  Pigmentary changes and lattice inferotemporal, no retinal breaks by scleral depression, 25d OU, Lattice degeneration, 5-7,  Traction @ 3, lattice at 5, and 6, good pexy          IMAGING AND PROCEDURES  Imaging and Procedures for 06/06/20  Color Fundus Photography Optos - OU - Both Eyes       Right Eye Progression has been stable. Macula : normal observations. Vessels : normal observations. Periphery : lattice, degeneration.   Left Eye Progression has been stable. Disc findings include normal observations. Macula : normal observations. Vessels : normal observations. Periphery : lattice, degeneration.   Notes Lattice degeneration noted inferiorly, no details of the tear inferiorly seen clinically can be identified OD on colors  OS, lattice degeneration temporally                ASSESSMENT/PLAN:  Right epiretinal membrane The nature of macular pucker (epiretinal membrane ERM) was discussed with the patient as well as threshold criteria for vitrectomy surgery. I explained  that in rare cases another surgery is needed to actually remove a second wrinkle should it regrow.  Most often, the epiretinal membrane and underlying wrinkled internal limiting membrane are removed with the first surgery, to accomplish the goals.   If the operative eye is Phak[place] (natural lens still present), cataract  surgery is often recommended prior to Vitrectomy. This will enable the retina surgeon to have the best view during surgery and the patient to obtain optimal results in the future. Treatment options were discussed.  OD, with persistent epiretinal membrane with secondary foveal macular schisis.  Intact outer foveal retina with maintenance of 20/25 vision postop cataract surgery.  Minor increase in retinal thickness yet with no change appreciably in the visual acuity.  Moreover no increase in foveal macular schisis in the parafoveal region right  Patient will have the option 1 day of vitrectomy membrane peel should the foveal distortion impact her visual functioning at all.  Left epiretinal membrane Minor epiretinal membrane nasal to the fovea, irregular foveal region  Cystoid macular edema of left eye This is not an active condition in the left eye. 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  Round hole of right eye Laser retinopexy 01/28/2018, and 11/21  Macular retinoschisis of both eyes Secondary to epiretinal membrane in each eye.      ICD-10-CM   1. Lattice degeneration of retina, left  H35.412 Color Fundus Photography Optos - OU - Both Eyes  2. Right epiretinal membrane  H35.371 Color Fundus Photography Optos - OU - Both Eyes  3. Left epiretinal membrane  H35.372   4. Cystoid macular edema of left eye  H35.352   5. Round hole of right eye  H33.321   6. Round hole of left eye  H33.322   7. Macular retinoschisis of both eyes  H33.103     1.  No interval change in the maculae other than some mild thickening of the retina secondary to  epiretinal membrane OD yet with good acuity will continue to observe  2.  No real macular stasis is coming from the the thickening of the retina, with no impact on acuity will continue to observe.  3.  Patient is using the myopic refractive nature of her left eye with magnification so that she can do that her research.  She understands that this is a physiologic optic issue and when the cataract develops in the left eye that 1 day she may have to use either no glasses with a magnifying lens or request being made highly nearsighted with cataract surgical refraction.  I am placing this note because the patient has some advanced degree of understanding regarding these issues and will be able to talk with her cataract surgeon in the future about it  Ophthalmic Meds Ordered this visit:  No orders of the defined types were placed in this encounter.      Return in about 6 months (around 12/06/2020) for DILATE OU, COLOR FP, OCT.  There are no Patient Instructions on file for this visit.   Explained the diagnoses, plan, and follow up with the patient and they expressed understanding.  Patient expressed understanding of the importance of proper follow up care.   Clent Demark Amyia Lodwick M.D. Diseases & Surgery of the Retina and Vitreous [facility] 06/06/20     Abbreviations: M myopia (nearsighted); A astigmatism; H hyperopia (farsighted); P presbyopia; Mrx spectacle prescription;  CTL contact lenses; OD right eye; OS left eye; OU both eyes  XT exotropia; ET esotropia; PEK punctate epithelial keratitis; PEE punctate epithelial erosions; DES dry eye syndrome; MGD meibomian gland dysfunction; ATs artificial tears; PFAT's preservative free artificial tears; [place] nuclear sclerotic cataract; PSC posterior subcapsular cataract; ERM epi-retinal membrane; PVD posterior vitreous detachment; RD retinal detachment; DM  diabetes mellitus; DR diabetic retinopathy; NPDR non-proliferative diabetic  retinopathy; PDR proliferative diabetic retinopathy; CSME clinically significant macular edema; DME diabetic macular edema; dbh dot blot hemorrhages; CWS cotton wool spot; POAG primary open angle glaucoma; C/D cup-to-disc ratio; HVF humphrey visual field; GVF goldmann visual field; OCT optical coherence tomography; IOP intraocular pressure; BRVO Branch retinal vein occlusion; CRVO central retinal vein occlusion; CRAO central retinal artery occlusion; BRAO branch retinal artery occlusion; RT retinal tear; SB scleral buckle; PPV pars plana vitrectomy; VH Vitreous hemorrhage; PRP panretinal laser photocoagulation; IVK intravitreal kenalog; VMT vitreomacular traction; MH Macular hole;  NVD neovascularization of the disc; NVE neovascularization elsewhere; AREDS age related eye disease study; ARMD age related macular degeneration; POAG primary open angle glaucoma; EBMD epithelial/anterior basement membrane dystrophy; ACIOL anterior chamber intraocular lens; IOL intraocular lens; PCIOL posterior chamber intraocular lens; Phaco/IOL phacoemulsification with intraocular lens placement; [place] photorefractive keratectomy; LASIK laser assisted in situ keratomileusis; HTN hypertension; DM diabetes mellitus; COPD chronic obstructive pulmonary disease

## 2020-06-06 NOTE — Assessment & Plan Note (Signed)
Secondary to epiretinal membrane in each eye.

## 2020-12-06 ENCOUNTER — Encounter (INDEPENDENT_AMBULATORY_CARE_PROVIDER_SITE_OTHER): Payer: BC Managed Care – PPO | Admitting: Ophthalmology
